# Patient Record
Sex: Female | Born: 1990
Health system: Southern US, Community
[De-identification: ages and names within clinical notes are randomized; demographics above are authoritative.]

## PROBLEM LIST (undated history)

## (undated) DIAGNOSIS — F99 Mental disorder, not otherwise specified: Secondary | ICD-10-CM

## (undated) DIAGNOSIS — O24419 Gestational diabetes mellitus in pregnancy, unspecified control: Secondary | ICD-10-CM

## (undated) DIAGNOSIS — F338 Other recurrent depressive disorders: Secondary | ICD-10-CM

## (undated) HISTORY — DX: Mental disorder, not otherwise specified: F99

## (undated) HISTORY — DX: Gestational diabetes mellitus in pregnancy, unspecified control: O24.419

## (undated) HISTORY — DX: Other recurrent depressive disorders: F33.8

---
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
## 2014-07-24 ENCOUNTER — Encounter (HOSPITAL_COMMUNITY): Payer: Self-pay | Admitting: Emergency Medicine

## 2014-07-24 ENCOUNTER — Emergency Department (HOSPITAL_COMMUNITY)
Admission: EM | Admit: 2014-07-24 | Discharge: 2014-07-24 | Disposition: A | Discharge status: Home / Self Care | Payer: Self-pay | Attending: Emergency Medicine | Admitting: Emergency Medicine

## 2014-07-24 DIAGNOSIS — J029 Acute pharyngitis, unspecified: Secondary | ICD-10-CM | POA: Insufficient documentation

## 2014-07-24 LAB — RAPID STREP SCREEN (MED CTR MEBANE ONLY): Streptococcus, Group A Screen (Direct): NEGATIVE

## 2014-07-24 NOTE — ED Provider Notes (Signed)
CSN: [id number]     Arrival date & time 07/24/14  0039 History   First MD Initiated Contact with Patient 07/24/14 [phone]     Chief Complaint  Patient presents with  . Sore Throat   HPI  24 year old female presents today with complaints of sore throat patient is with her father who insisted she be seen for sore throat and episodic coughing. Patient reports that for the past 3 nights she's had episodes of coughing that awakened her from sleep followed by sore throat. She notes she is able to fall back asleep without difficulty and reports no symptoms during the day. She denies upper respiratory symptoms including rhinorrhea initially watery eyes ear pain, sinus pressure, or sore throat aside from coughing episodes. She denies chest pain shortness of breath nausea vomiting, abdominal pain, or any other symptoms/signs. Patient has no complaints at time of evaluation, and when asked about her concerns today she states "I'm here because he wanted me to"  History reviewed. No pertinent past medical history. History reviewed. No pertinent past surgical history. No family history on file. History  Substance Use Topics  . Smoking status: Never Smoker   . Smokeless tobacco: Not on file  . Alcohol Use: No   OB History    No data available     Review of Systems  All other systems reviewed and are negative.   Allergies  Review of patient's allergies indicates no known allergies.  Home Medications   Prior to Admission medications   Not on File   BP 104/67 mmHg  Pulse 78  Temp(Src) 98.1 F (36.7 C)  Resp 17  Ht  (1.651 m)  Wt 107 lb (48.535 kg)  BMI 17.81 kg/m2  SpO2 99%  LMP 07/10/2014 Physical Exam  Constitutional: She is oriented to person, place, and time. She appears well-developed and well-nourished.  HENT:  Head: Normocephalic and atraumatic.  Right Ear: Hearing, tympanic membrane, external ear and ear canal normal.  Left Ear: Hearing, tympanic membrane, external ear and  ear canal normal.  Nose: Nose normal. No mucosal edema, rhinorrhea or sinus tenderness. Right sinus exhibits no maxillary sinus tenderness and no frontal sinus tenderness. Left sinus exhibits no maxillary sinus tenderness and no frontal sinus tenderness.  Mouth/Throat: Uvula is midline, oropharynx is clear and moist and mucous membranes are normal. No oropharyngeal exudate, posterior oropharyngeal edema, posterior oropharyngeal erythema or tonsillar abscesses.  Eyes: Conjunctivae are normal. Pupils are equal, round, and reactive to light. Right eye exhibits no discharge. Left eye exhibits no discharge. No scleral icterus.  Neck: Normal range of motion. Neck supple. No JVD present. No tracheal deviation present. No thyromegaly present.  Cardiovascular: Normal rate, regular rhythm and intact distal pulses.  Exam reveals no gallop and no friction rub.   No murmur heard. Pulmonary/Chest: Effort normal and breath sounds normal. No stridor. No respiratory distress. She has no wheezes. She has no rales. She exhibits no tenderness.  Musculoskeletal: Normal range of motion.  Neurological: She is alert and oriented to person, place, and time. Coordination normal.  Skin: Skin is warm and dry.  Psychiatric: She has a normal mood and affect. Her behavior is normal. Judgment and thought content normal.  Nursing note and vitals reviewed.   ED Course  Procedures (including critical care time) Labs Review Labs Reviewed  RAPID STREP SCREEN  CULTURE, GROUP A STREP    Imaging Review No results found.   EKG Interpretation None     MDM   Final diagnoses:  Sore throat   Negative rapid strep  Time of evaluation patient did not have any complaints, reporting the only time she has sore throat as when she coughs which has been episodic and only occurring once a night for the last 3 nights. Patient did not appear to have allergic symptoms, or any other source of infection. Patient was advised to use  Benadryl as needed at night for sleep. If new or worsening symptoms present she is instructed follow-up. Patient and her father understood and agreed to this plan.  Eyvonne MechanicJeffrey Alijah Akram, PA-C 07/24/14 [phone]  Blane OharaJoshua Zavitz, MD 07/24/14 [phone]

## 2014-07-24 NOTE — ED Notes (Signed)
Pt reports sore throat and dry cough X 3 days, took Dayquil at home without relief.

## 2014-07-24 NOTE — Discharge Instructions (Signed)
Please continue to monitor for any worsening signs or symptoms. Benadryl can be used at night for sleep and for potential allergy related symptoms. If patient continues to have cough and tonight please follow up with her primary care provider for further evaluation and management. If symptoms worsen including difficulty breathing, fever, difficulty swallowing, or any other concerning symptoms please follow up immediately.

## 2014-07-27 LAB — CULTURE, GROUP A STREP

## 2014-08-26 ENCOUNTER — Emergency Department (HOSPITAL_COMMUNITY)
Admission: EM | Admit: 2014-08-26 | Discharge: 2014-08-26 | Disposition: A | Discharge status: Home / Self Care | Payer: Self-pay | Attending: Emergency Medicine | Admitting: Emergency Medicine

## 2014-08-26 ENCOUNTER — Encounter (HOSPITAL_COMMUNITY): Payer: Self-pay | Admitting: Emergency Medicine

## 2014-08-26 DIAGNOSIS — Y998 Other external cause status: Secondary | ICD-10-CM | POA: Insufficient documentation

## 2014-08-26 DIAGNOSIS — S161XXA Strain of muscle, fascia and tendon at neck level, initial encounter: Secondary | ICD-10-CM | POA: Insufficient documentation

## 2014-08-26 DIAGNOSIS — S0081XA Abrasion of other part of head, initial encounter: Secondary | ICD-10-CM | POA: Insufficient documentation

## 2014-08-26 DIAGNOSIS — Y9389 Activity, other specified: Secondary | ICD-10-CM | POA: Insufficient documentation

## 2014-08-26 DIAGNOSIS — Y92410 Unspecified street and highway as the place of occurrence of the external cause: Secondary | ICD-10-CM | POA: Insufficient documentation

## 2014-08-26 MED ORDER — IBUPROFEN 600 MG PO TABS: 600.0000 mg | ORAL_TABLET | Freq: Four times a day (QID) | ORAL | Status: DC | PRN

## 2014-08-26 MED ORDER — IBUPROFEN 400 MG PO TABS
600.0000 mg | ORAL_TABLET | Freq: Once | ORAL | Status: AC
  Administered 2014-08-26: 600 mg via ORAL
  Filled 2014-08-26 (×2): qty 1

## 2014-08-26 NOTE — Discharge Instructions (Signed)
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
## 2015-05-12 HISTORY — PX: WISDOM TOOTH EXTRACTION: SHX21

## 2016-07-07 DIAGNOSIS — R87612 Low grade squamous intraepithelial lesion on cytologic smear of cervix (LGSIL): Secondary | ICD-10-CM | POA: Insufficient documentation

## 2016-10-18 ENCOUNTER — Encounter (HOSPITAL_COMMUNITY): Payer: Self-pay | Admitting: Emergency Medicine

## 2016-10-18 ENCOUNTER — Emergency Department (HOSPITAL_COMMUNITY)
Admission: EM | Admit: 2016-10-18 | Discharge: 2016-10-18 | Disposition: A | Discharge status: Home / Self Care | Payer: Self-pay | Attending: Emergency Medicine | Admitting: Emergency Medicine

## 2016-10-18 DIAGNOSIS — R112 Nausea with vomiting, unspecified: Secondary | ICD-10-CM | POA: Insufficient documentation

## 2016-10-18 DIAGNOSIS — K5900 Constipation, unspecified: Secondary | ICD-10-CM | POA: Insufficient documentation

## 2016-10-18 MED ORDER — POLYETHYLENE GLYCOL 3350 17 G PO PACK: 17.0000 g | PACK | Freq: Every day | ORAL | 0 refills | Status: DC

## 2016-10-18 MED ORDER — ONDANSETRON 4 MG PO TBDP
4.0000 mg | ORAL_TABLET | Freq: Once | ORAL | Status: AC
  Administered 2016-10-18: 4 mg via ORAL
  Filled 2016-10-18: qty 1

## 2016-10-18 MED ORDER — ONDANSETRON HCL 4 MG PO TABS: 4.0000 mg | ORAL_TABLET | Freq: Four times a day (QID) | ORAL | 0 refills | Status: DC

## 2016-10-18 NOTE — ED Provider Notes (Signed)
[facility]-EMERGENCY DEPT Provider Note   CSN: [id number] Arrival date & time: 10/18/16  1320     History   Chief Complaint Chief Complaint  Patient presents with  . Emesis  . Constipation    HPI Candace Lowe is a 26 y.o. female who presents to the emergency department with constipation, nausea and emesis. She reports her last bowel movement was 4 days ago, but she is still able to pass flatus. 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 She reports that she called her boss. She wasn't feeling well and returned home and vomited one more time. No fever, chills, abdominal pain, back pain, diarrhea, dysuria, vaginal pain, BRBPR, or any other symptoms at this time. No history of abdominal surgery. No chronic past medical history. No daily medications.  The history is provided by the patient. No language interpreter was used.    History reviewed. No pertinent past medical history.  There are no active problems to display for this patient.   History reviewed. No pertinent surgical history.  OB History    No data available       Home Medications    Prior to Admission medications   Medication Sig Start Date End Date Taking? Authorizing Provider  ondansetron (ZOFRAN) 4 MG tablet Take 1 tablet (4 mg total) by mouth every 6 (six) hours. 10/18/16   Dawanda Mapel A, PA-C  polyethylene glycol (MIRALAX) packet Take 17 g by mouth daily. 10/18/16   Anaira Seay A, PA-C    Family History No family history on file.  Social History Social History  Substance Use Topics  . Smoking status: Never Smoker  . Smokeless tobacco: Never Used  . Alcohol use No     Allergies   Mushroom extract complex   Review of Systems Review of Systems  Constitutional: Negative for activity change, chills and fever.  Respiratory: Negative for shortness of breath.   Cardiovascular: Negative for chest pain.  Gastrointestinal:  Positive for constipation, nausea and vomiting. Negative for abdominal pain and diarrhea.  Genitourinary: Negative for dysuria and vaginal pain.  Musculoskeletal: Negative for back pain.  Skin: Negative for rash.     Physical Exam Updated Vital Signs BP 108/78 (BP Location: Left Arm)   Pulse 60   Temp 98.6 F (37 C) (Oral)   Resp 18   LMP 10/17/2016   SpO2 100%   Physical Exam  Constitutional: No distress.  HENT:  Head: Normocephalic.  Eyes: Conjunctivae are normal.  Neck: Neck supple.  Cardiovascular: Normal rate, regular rhythm, normal heart sounds and intact distal pulses.  Exam reveals no gallop and no friction rub.   No murmur heard. Pulmonary/Chest: Effort normal and breath sounds normal. No respiratory distress. She has no wheezes. She has no rales.  Abdominal: Soft. Bowel sounds are normal. She exhibits no distension and no mass. There is no tenderness. There is no rebound and no guarding.  The abdomen is nontender. Normoactive bowel sounds in all 4 quadrants.  Musculoskeletal: Normal range of motion. She exhibits no edema or tenderness.  Neurological: She is alert.  Skin: Skin is warm. Capillary refill takes less than 2 seconds. No rash noted.  Psychiatric: Her behavior is normal.  Nursing note and vitals reviewed.    ED Treatments / Results  Labs (all labs ordered are listed, but only abnormal results are displayed) Labs Reviewed - No data to display  EKG  EKG Interpretation None       Radiology No results found.  Procedures Procedures (including critical care time)  Medications Ordered in ED Medications  ondansetron (ZOFRAN-ODT) disintegrating tablet 4 mg (4 mg Oral Given 10/18/16 1446)     Initial Impression / Assessment and Plan / ED Course  I have reviewed the triage vital signs and the nursing notes.  Pertinent labs & imaging results that were available during my care of the patient were reviewed by me and considered in my medical decision  making (see chart for details).     Patient with constipation and emesis 2. Nausea improved in the emergency department after Zofran administration. Physical exam of the abdomen is unremarkable. Bowel sounds present in all 4 quadrants. The patient is afebrile. Vital signs stable. No acute distress. No clinical signs of dehydration. At this time, I do not feel further labs or imaging is warranted. Patient successfully fluid challenge. Will discharge the patient to home with PCP follow-up, Zofran, and MiraLAX. Discussed the plan with the patient has agreeable at this time.  Final Clinical Impressions(s) / ED Diagnoses   Final diagnoses:  Constipation, unspecified constipation type    New Prescriptions Discharge Medication List as of 10/18/2016  3:35 PM    START taking these medications   Details  ondansetron (ZOFRAN) 4 MG tablet Take 1 tablet (4 mg total) by mouth every 6 (six) hours., Starting Sun 10/18/2016, Print    polyethylene glycol (MIRALAX) packet Take 17 g by mouth daily., Starting Sun 10/18/2016, Print         Jeanie Mccard A, PA-C 10/20/16 1610    Azalia Bilis, MD 10/26/16 1008

## 2016-10-18 NOTE — Discharge Instructions (Signed)
You can take Zofran every 6 hours as needed for nausea. You can use Miralax daily until you have a soft bowel movement. If you call the number on your discharge paperwork to get established with a primary care provider or you can call [employer] directly.  If you develop new or worsening symptoms including the inability to pass gas, worsening constipation or vomiting, fever, or chills, please return to the Emergency Department for re-evaluation.

## 2016-10-18 NOTE — ED Triage Notes (Addendum)
Pt from home with c/o emesis that began today and constipation. Pt's LBM was 3-5 days ago. Pt denies fever or chills. Pt denies urinary symptoms. Pt has had 2 episodes of emesis today. Pt has hypoactive bowel sounds. Pt refusing blood work at this time

## 2018-07-20 ENCOUNTER — Encounter (HOSPITAL_COMMUNITY): Payer: Self-pay | Admitting: Emergency Medicine

## 2018-07-20 ENCOUNTER — Other Ambulatory Visit: Payer: Self-pay

## 2018-07-20 ENCOUNTER — Emergency Department (HOSPITAL_COMMUNITY)
Admission: EM | Admit: 2018-07-20 | Discharge: 2018-07-20 | Disposition: A | Discharge status: Home / Self Care | Payer: Self-pay | Attending: Emergency Medicine | Admitting: Emergency Medicine

## 2018-07-20 ENCOUNTER — Emergency Department (HOSPITAL_COMMUNITY): Payer: Self-pay

## 2018-07-20 DIAGNOSIS — R6883 Chills (without fever): Secondary | ICD-10-CM | POA: Insufficient documentation

## 2018-07-20 DIAGNOSIS — J40 Bronchitis, not specified as acute or chronic: Secondary | ICD-10-CM | POA: Insufficient documentation

## 2018-07-20 DIAGNOSIS — R0981 Nasal congestion: Secondary | ICD-10-CM | POA: Insufficient documentation

## 2018-07-20 DIAGNOSIS — R070 Pain in throat: Secondary | ICD-10-CM | POA: Insufficient documentation

## 2018-07-20 MED ORDER — AZITHROMYCIN 250 MG PO TABS: 250.0000 mg | ORAL_TABLET | Freq: Every day | ORAL | 0 refills | Status: DC

## 2018-07-20 NOTE — ED Provider Notes (Signed)
[facility]-EMERGENCY DEPT Provider Note   CSN: [id number] Arrival date & time: 07/20/18  1124    History   Chief Complaint Chief Complaint  Patient presents with  . Cough  . Sore Throat    HPI Candace Lowe is a 28 y.o. female here presenting with sore throat, congestion, chills.  Patient states that for the last week or so she has been having productive cough with yellow sputum.  She also has some sore throat and sinus congestion.  She has some subjective chills as well.  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  Sent here from work for work note.     The history is provided by the patient.    History reviewed. No pertinent past medical history.  There are no active problems to display for this patient.   History reviewed. No pertinent surgical history.   OB History   No obstetric history on file.      Home Medications    Prior to Admission medications   Medication Sig Start Date End Date Taking? Authorizing Provider  ondansetron (ZOFRAN) 4 MG tablet Take 1 tablet (4 mg total) by mouth every 6 (six) hours. 10/18/16   McDonald, Mia A, PA-C  polyethylene glycol (MIRALAX) packet Take 17 g by mouth daily. 10/18/16   McDonald, Mia A, PA-C    Family History No family history on file.  Social History Social History   Tobacco Use  . Smoking status: Never Smoker  . Smokeless tobacco: Never Used  Substance Use Topics  . Alcohol use: No  . Drug use: Not on file     Allergies   Mushroom extract complex   Review of Systems Review of Systems  Respiratory: Positive for cough.   All other systems reviewed and are negative.    Physical Exam Updated Vital Signs BP 107/70 (BP Location: Left Arm)   Pulse 72   Temp 98.2 F (36.8 C) (Oral)   Resp 16   Ht 5\' 4"  (1.626 m)   Wt 45.8 kg   LMP 06/29/2018   SpO2 94%   BMI 17.34 kg/m   Physical Exam Vitals signs and nursing note reviewed.  Constitutional:      Comments:  Coughing   HENT:     Head: Normocephalic.     Mouth/Throat:     Mouth: Mucous membranes are moist.     Comments: OP slightly red, no tonsillar exudates  Eyes:     Conjunctiva/sclera: Conjunctivae normal.  Neck:     Musculoskeletal: Normal range of motion.  Cardiovascular:     Rate and Rhythm: Normal rate and regular rhythm.  Pulmonary:     Effort: Pulmonary effort is normal.     Comments: Diminished bilaterally, no obvious wheezing or crackles  Abdominal:     General: Bowel sounds are normal.     Palpations: Abdomen is soft.  Skin:    General: Skin is warm.     Capillary Refill: Capillary refill takes less than 2 seconds.  Neurological:     General: No focal deficit present.     Mental Status: She is alert and oriented to person, place, and time.  Psychiatric:        Mood and Affect: Mood normal.        Behavior: Behavior normal.      ED Treatments / Results  Labs (all labs ordered are listed, but only abnormal results are displayed) Labs Reviewed - No data  to display  EKG None  Radiology No results found.  Procedures Procedures (including critical care time)  Medications Ordered in ED Medications - No data to display   Initial Impression / Assessment and Plan / ED Course  I have reviewed the triage vital signs and the nursing notes.  Pertinent labs & imaging results that were available during my care of the patient were reviewed by me and considered in my medical decision making (see chart for details).       Candace Lowe is a 28 y.o. female here with productive cough, chills. Afebrile, no recent travel or sick contacts. Likely bronchitis vs early pneumonia. Will get CXR.   1:11 PM CXR clear. Consider atypical pneumonia. Will give zpack. Stable for discharge.    Final Clinical Impressions(s) / ED Diagnoses   Final diagnoses:  None    ED Discharge Orders    None       Charlynne Pander, MD 07/20/18 1311

## 2018-07-20 NOTE — ED Triage Notes (Addendum)
Pt c/o cough and congestion with sore throat for week. Sputum is yellowish when coughing up. Pt was sent home from work today and needs note before she can return .

## 2018-07-20 NOTE — Discharge Instructions (Signed)
Take zpack as prescribed.   Take tylenol, motrin for fever.   See your doctor.   Rest for 3 days at home   Return to ER if you have worse cough, fever, trouble breathing

## 2019-06-22 ENCOUNTER — Other Ambulatory Visit: Payer: Self-pay

## 2019-06-22 ENCOUNTER — Ambulatory Visit (HOSPITAL_COMMUNITY)
Admission: EM | Admit: 2019-06-22 | Discharge: 2019-06-22 | Disposition: A | Discharge status: Home / Self Care | Payer: Self-pay | Attending: Emergency Medicine | Admitting: Emergency Medicine

## 2019-06-22 ENCOUNTER — Encounter (HOSPITAL_COMMUNITY): Payer: Self-pay

## 2019-06-22 DIAGNOSIS — N898 Other specified noninflammatory disorders of vagina: Secondary | ICD-10-CM

## 2019-06-22 DIAGNOSIS — Z3202 Encounter for pregnancy test, result negative: Secondary | ICD-10-CM

## 2019-06-22 LAB — POCT PREGNANCY, URINE: Preg Test, Ur: NEGATIVE

## 2019-06-22 LAB — POC URINE PREG, ED
Preg Test, Ur: NEGATIVE
Preg Test, Ur: NEGATIVE

## 2019-06-22 MED ORDER — FLUCONAZOLE 150 MG PO TABS: 150.0000 mg | ORAL_TABLET | Freq: Once | ORAL | 0 refills | Status: AC

## 2019-06-22 NOTE — ED Triage Notes (Signed)
Pt state she has yeast infection x 2 days.

## 2019-06-22 NOTE — ED Provider Notes (Signed)
[place]    CSN: [id number] Arrival date & time: 06/22/19  1037      History   Chief Complaint Chief Complaint  Patient presents with  . Vaginal Discharge    HPI Candace Lowe is a 29 y.o. female significant past medical history presenting today for evaluation of vaginal discharge and irritation.  Patient states that over the past 3 days she has had some irritation and itching, and the past 24 hours she has developed associated discharge.  Believes this is similar to past yeast infection and has history of multiple yeast infections.  Not a significant history of bacterial vaginosis.  She denies any abdominal pain or pelvic pain.  Denies nausea or vomiting.  Last menstrual cycle ended approximately 1.5 weeks ago.  She is not on any form of birth control.  Is in monogamous relationship with 1 partner.  Denies concerns for STDs.  HPI  History reviewed. No pertinent past medical history.  There are no problems to display for this patient.   History reviewed. No pertinent surgical history.  OB History   No obstetric history on file.      Home Medications    Prior to Admission medications   Medication Sig Start Date End Date Taking? Authorizing Provider  azithromycin (ZITHROMAX) 250 MG tablet Take 1 tablet (250 mg total) by mouth daily. Take first 2 tablets together, then 1 every day until finished. 07/20/18   Drenda Freeze, MD  fluconazole (DIFLUCAN) 150 MG tablet Take 1 tablet (150 mg total) by mouth once for 1 dose. 06/22/19 06/22/19  Vasti Yagi C, PA-C  ondansetron (ZOFRAN) 4 MG tablet Take 1 tablet (4 mg total) by mouth every 6 (six) hours. 10/18/16   McDonald, Mia A, PA-C  polyethylene glycol (MIRALAX) packet Take 17 g by mouth daily. 10/18/16   McDonald, Mia A, PA-C    Family History No family history on file.  Social History Social History   Tobacco Use  . Smoking status: Never Smoker  . Smokeless tobacco: Never Used  Substance Use Topics    . Alcohol use: No  . Drug use: Not on file     Allergies   Mushroom extract complex   Review of Systems Review of Systems  Constitutional: Negative for fever.  Respiratory: Negative for shortness of breath.   Cardiovascular: Negative for chest pain.  Gastrointestinal: Negative for abdominal pain, diarrhea, nausea and vomiting.  Genitourinary: Positive for vaginal discharge. Negative for dysuria, flank pain, genital sores, hematuria, menstrual problem, vaginal bleeding and vaginal pain.  Musculoskeletal: Negative for back pain.  Skin: Negative for rash.  Neurological: Negative for dizziness, light-headedness and headaches.     Physical Exam Triage Vital Signs ED Triage Vitals  Enc Vitals Group     BP --      Pulse Rate 06/22/19 1111 64     Resp 06/22/19 1111 16     Temp 06/22/19 1111 98.6 F (37 C)     Temp Source 06/22/19 1111 Oral     SpO2 06/22/19 1111 100 %     Weight 06/22/19 1110 103 lb (46.7 kg)     Height --      Head Circumference --      Peak Flow --      Pain Score 06/22/19 1109 5     Pain Loc --      Pain Edu? --      Excl. in [place]? --    No data found.  Updated  Vital Signs Pulse 64   Temp 98.6 F (37 C) (Oral)   Resp 16   Wt 103 lb (46.7 kg)   SpO2 100%   BMI 17.68 kg/m   Visual Acuity Right Eye Distance:   Left Eye Distance:   Bilateral Distance:    Right Eye Near:   Left Eye Near:    Bilateral Near:     Physical Exam Vitals and nursing note reviewed.  Constitutional:      Appearance: She is well-developed.     Comments: No acute distress  HENT:     Head: Normocephalic and atraumatic.     Nose: Nose normal.  Eyes:     Conjunctiva/sclera: Conjunctivae normal.  Cardiovascular:     Rate and Rhythm: Normal rate.  Pulmonary:     Effort: Pulmonary effort is normal. No respiratory distress.  Abdominal:     General: There is no distension.     Comments: Soft, nondistended, nontender to light and deep palpation throughout   Musculoskeletal:        General: Normal range of motion.     Cervical back: Neck supple.  Skin:    General: Skin is warm and dry.  Neurological:     Mental Status: She is alert and oriented to person, place, and time.      UC Treatments / Results  Labs (all labs ordered are listed, but only abnormal results are displayed) Labs Reviewed  POC URINE PREG, ED  POCT PREGNANCY, URINE  POC URINE PREG, ED  CERVICOVAGINAL ANCILLARY ONLY    EKG   Radiology No results found.  Procedures Procedures (including critical care time)  Medications Ordered in UC Medications - No data to display  Initial Impression / Assessment and Plan / UC Course  I have reviewed the triage vital signs and the nursing notes.  Pertinent labs & imaging results that were available during my care of the patient were reviewed by me and considered in my medical decision making (see chart for details).    Pregnancy test negative. Empirically treating for yeast today with Diflucan.  Vaginal swab pending to further evaluate for causes of discharge and irritation.  Will call with results and provide further treatment as needed.  Discussed strict return precautions. Patient verbalized understanding and is agreeable with plan.  Final Clinical Impressions(s) / UC Diagnoses   Final diagnoses:  Vaginal discharge     Discharge Instructions     Take 1 tablet of Diflucan today, may repeat in 3 to 4 days if swab returns positive for yeast and still having symptoms  We are testing you for Gonorrhea, Chlamydia, Trichomonas, Yeast and Bacterial Vaginosis. We will call you if anything is positive and let you know if you require any further treatment. Please inform partners of any positive results.   Please return if symptoms not improving with treatment, development of fever, nausea, vomiting, abdominal pain.     ED Prescriptions    Medication Sig Dispense Auth. Provider   fluconazole (DIFLUCAN) 150 MG tablet  Take 1 tablet (150 mg total) by mouth once for 1 dose. 2 tablet Tameisha Covell, [place] C, PA-C     PDMP not reviewed this encounter.   Lew Dawes, PA-C 06/22/19 1218

## 2019-06-22 NOTE — Discharge Instructions (Addendum)
Take 1 tablet of Diflucan today, may repeat in 3 to 4 days if swab returns positive for yeast and still having symptoms  We are testing you for Gonorrhea, Chlamydia, Trichomonas, Yeast and Bacterial Vaginosis. We will call you if anything is positive and let you know if you require any further treatment. Please inform partners of any positive results.   Please return if symptoms not improving with treatment, development of fever, nausea, vomiting, abdominal pain.

## 2019-06-27 LAB — CERVICOVAGINAL ANCILLARY ONLY
Bacterial vaginitis: POSITIVE — AB
Candida vaginitis: POSITIVE — AB
Chlamydia: NEGATIVE
Neisseria Gonorrhea: NEGATIVE
Trichomonas: NEGATIVE

## 2019-06-28 ENCOUNTER — Encounter (HOSPITAL_COMMUNITY): Payer: Self-pay

## 2019-06-28 ENCOUNTER — Telehealth (HOSPITAL_COMMUNITY): Payer: Self-pay | Admitting: Emergency Medicine

## 2019-06-28 MED ORDER — METRONIDAZOLE 500 MG PO TABS: 500.0000 mg | ORAL_TABLET | Freq: Two times a day (BID) | ORAL | 0 refills | Status: AC

## 2019-06-28 NOTE — Telephone Encounter (Signed)
Bacterial vaginosis is positive. Pt needs treatment. Flagyl 500 mg BID x 7 days #14 no refills sent to patients pharmacy of choice.    Candida (yeast) is positive.  Prescription for fluconazole was given at the urgent care visit.    Attempted to reach patient. No answer at this time. Voicemail left.   If you have any questions, you may call me at [phone]

## 2019-12-07 ENCOUNTER — Ambulatory Visit (INDEPENDENT_AMBULATORY_CARE_PROVIDER_SITE_OTHER): Payer: Self-pay | Admitting: Lactation Services

## 2019-12-07 ENCOUNTER — Other Ambulatory Visit: Payer: Self-pay

## 2019-12-07 ENCOUNTER — Ambulatory Visit (HOSPITAL_COMMUNITY)
Admission: EM | Admit: 2019-12-07 | Discharge: 2019-12-07 | Disposition: A | Discharge status: Home / Self Care | Payer: Self-pay

## 2019-12-07 DIAGNOSIS — Z3201 Encounter for pregnancy test, result positive: Secondary | ICD-10-CM

## 2019-12-07 LAB — POCT PREGNANCY, URINE: Preg Test, Ur: POSITIVE — AB

## 2019-12-07 MED ORDER — PRENATAL VITAMINS 28-0.8 MG PO TABS: 1.0000 | ORAL_TABLET | Freq: Every day | ORAL | 11 refills | Status: DC

## 2019-12-07 NOTE — Progress Notes (Signed)
Pregnancy test +. Called patient with results, patient did not answer, LM for patient to call the office for results.   Called patient to give her results at 4:12 pm. Patient reports her LMP was 10/17/2019 and some spotting around July 5 on and off for 3 days. Patient with + UPT yesterday at home. Discussed that some spotting is normal with pregnancy.   LMP of 10/17/2019 with EDD 07/24/2019 (7 weeks 2 days). Note to front desk to call and schedule for New OB intake and new OB appt   Patient is not taking PNV. Prescription for PNV sent to [facility] on [address].    Ectopic precautions reviewed. Location of MAU given.   Patient to call with questions or concerns as needed

## 2019-12-08 NOTE — Progress Notes (Signed)
Chart reviewed for nurse visit. Agree with plan of care.   Duane Lope, NP 12/08/2019 4:24 PM

## 2020-01-05 ENCOUNTER — Encounter: Payer: Self-pay | Admitting: Medical

## 2020-01-05 ENCOUNTER — Other Ambulatory Visit: Payer: Self-pay

## 2020-01-05 ENCOUNTER — Ambulatory Visit (INDEPENDENT_AMBULATORY_CARE_PROVIDER_SITE_OTHER): Payer: Self-pay | Admitting: Medical

## 2020-01-05 ENCOUNTER — Other Ambulatory Visit (HOSPITAL_COMMUNITY)
Admission: RE | Admit: 2020-01-05 | Discharge: 2020-01-05 | Disposition: A | Discharge status: Home / Self Care | Payer: Self-pay | Source: Ambulatory Visit | Attending: Medical | Admitting: Medical

## 2020-01-05 VITALS — BP 106/72 | HR 72 | Wt 98.0 lb

## 2020-01-05 DIAGNOSIS — Z3481 Encounter for supervision of other normal pregnancy, first trimester: Secondary | ICD-10-CM | POA: Insufficient documentation

## 2020-01-05 DIAGNOSIS — Z3A11 11 weeks gestation of pregnancy: Secondary | ICD-10-CM

## 2020-01-05 DIAGNOSIS — Z3480 Encounter for supervision of other normal pregnancy, unspecified trimester: Secondary | ICD-10-CM | POA: Insufficient documentation

## 2020-01-05 DIAGNOSIS — O219 Vomiting of pregnancy, unspecified: Secondary | ICD-10-CM

## 2020-01-05 DIAGNOSIS — Z3689 Encounter for other specified antenatal screening: Secondary | ICD-10-CM

## 2020-01-05 DIAGNOSIS — Z3A19 19 weeks gestation of pregnancy: Secondary | ICD-10-CM

## 2020-01-05 LAB — POCT URINALYSIS DIP (DEVICE)
Bilirubin Urine: NEGATIVE
Glucose, UA: NEGATIVE mg/dL
Hgb urine dipstick: NEGATIVE
Ketones, ur: NEGATIVE mg/dL
Leukocytes,Ua: NEGATIVE
Nitrite: NEGATIVE
Protein, ur: NEGATIVE mg/dL
Specific Gravity, Urine: 1.02 (ref 1.005–1.030)
Urobilinogen, UA: 0.2 mg/dL (ref 0.0–1.0)
pH: 7.5 (ref 5.0–8.0)

## 2020-01-05 MED ORDER — PROMETHAZINE HCL 25 MG PO TABS: 25.0000 mg | ORAL_TABLET | Freq: Four times a day (QID) | ORAL | 0 refills | Status: DC | PRN

## 2020-01-05 NOTE — Patient Instructions (Addendum)
AREA PEDIATRIC/FAMILY PRACTICE PHYSICIANS  [place] ([address]) . [facility] o Chambliss, MD; Eniola, MD; Hale, MD; Hensel, MD; McDiarmid, MD; McIntyer, MD; Neal, MD; Walden, MD o [address] [place], [place] [address] o [phone] o Mon-Fri 8:30-12:30, 1:30-5:00 o Providers come to see babies at [facility] o Accepting Medicaid . [facility] o Limited providers who accept newborns: Koirala, MD; Morrow, MD; Wolters, MD o [address], [place], [place] [address] o [phone] o Mon-Fri 8:00-5:30 o Babies seen by providers at [facility] o Does NOT accept Medicaid o Please call early in hospitalization for appointment (limited availability)  . [facility] o Mulberry, MD o [address] [place], [place] [address] o [phone] o Mon, Tue, Thur, Fri 8:30-5:00, Wed 10:00-7:00 (closed 1-2pm) o Babies seen by [facility] providers o Accepting Medicaid . [facility] - Pediatrician o Rubin, MD o [address], [place], [place] [address] o [phone] o Mon-Fri 8:30-5:00, Sat 8:30-12:00 o Provider comes to see babies at [facility] o Accepting Medicaid o Must have been referred from current patients or contacted office prior to delivery . [facility] ([facility]) o Brown, MD; Chandler, MD; Ettefagh, MD; Grant, MD; Lester, MD; McCormick, MD; McQueen, MD; Prose, MD; Simha, MD; Stanley, MD; Stryffeler, NP; Tebben, NP o [address], [place], [place] [address] o [phone] o Mon, Tue, Thur, Fri 8:30-5:30, Wed 9:30-5:30, Sat 8:30-12:30 o Babies seen by [facility] providers o Accepting Medicaid o Only accepting infants of first-time parents or siblings of current patients o Hospital discharge coordinator will make follow-up appointment . Jack Amos o [address],  [place], [place]  [address] o [phone]   Fax - [phone] . [facility] o [address], [place], [place]  [address] o Phone - [phone]   Fax - [phone] . Shilpa Gosrani o [address], [place], [place]  [address] o [phone]  [place] ([address]) . [facility] o Bates, MD; [place], MD; Cooper, Cox, MD; MD; Davis, MD; Dovico, MD; Ettefaugh, MD; Little, MD; Lowe, MD; Keiffer, MD; Melvin, MD; Sumner, MD; Williams, MD o [address], [place], [place] [address] o [phone] o Mon-Fri 8:30-5:00 (extended evenings Mon-Thur as needed), Sat-Sun 10:00-1:00 o Providers come to see babies at [facility] o Accepting Medicaid for families of first-time babies and families with all children in the household age 3 and under. Must register with office prior to making appointment (M-F only). . [facility] o Henson, NP; Knapp, MD; Lalonde, MD; Tysinger, PA o [address] [place], [place] [address] o [phone] o Mon-Fri 8:00-5:00 o Babies seen by providers at [facility] o Does NOT accept Medicaid/Commercial Insurance Only . [facility] ([facility]  o Artis, MD; Barnes, MD; Bratton, MD; Coccaro, MD; Lockett Gardner, MD; Kramer, MD; Marshall, MD; Netherton, MD; Poleto, MD; Skinner, MD o [address] [place], [place] [address] o [phone] o Mon-Fri 8:30-5:30, Sat (Oct.-Mar.) 9:00-1:00 o Babies seen by providers at [facility] o Accepting Medicaid  [place] ([address]) . [facility] o Reid, MD; Warner, MD o [address], [place],  [address] o [phone] o Mon-Fri 8:30-5:00, Sat 8:30-12:00 o Providers come to see babies at [facility] o Does NOT accept Medicaid . [facility] o Becker, PA; Hagler, MD; Scifres, PA; Sun, MD; Swayne, MD o [address],  [place], [place] [address] o [phone] o Mon-Fri 8:00-5:00 o Babies seen by providers at [facility] o Does NOT accept Medicaid o Only accepting babies of parents who are patients o Please call early in hospitalization for appointment (limited availability) . [facility] o Clark, MD; Frye, MD; Kelleher, MD; Mack, NP; Miller, MD; O'Keller, MD; Patterson, NP; Pudlo, MD; Puzio, MD; Thomas, MD; Tucker, MD; Twiselton, MD o [address], [place], [place] [address] o [phone] o Mon-Fri 8:00-5:00, Sat 9:00-12:00 o Providers come to see babies at [facility] o Does NOT accept Medicaid  [place] ([address]) . [facility] o Limited providers accepting new patients: Brake, NP; Wharton, PA o [address], [place], [place] [address] o [phone] o Mon-Fri 8:00-5:00 o Babies seen by providers at [facility] o Does NOT accept Medicaid o Only accepting babies of parents who are patients o Please call early in hospitalization for appointment (limited availability) . [facility] o Gay, MD; Quinlan, MD o [address] [place], [place] [address] o [phone] (press 1 to schedule appointment) o Mon-Fri 8:00-5:00 o Providers come to see babies at [facility] o Does NOT accept Medicaid . [facility] o Mazer, MD o [address] [place], [place] [address] o [phone] o Mon-Fri 8:30-5:00 (lunch 12:30-1:00), extended hours by appointment only Wed 5:00-6:30 o Babies seen by [facility] providers o Accepting Medicaid . [facility] o Banks, MD; Jordan, MD; Koberlein, MD o [address], [place], [place] [address] o [phone] o Mon-Fri 8:00-5:00 o Babies seen by [facility] providers o Does NOT accept Medicaid . [facility] o Parker, MD; Hunter, MD; Wallace, DO o [address] [place], [place]  [address] o [phone] o Mon-Fri 8:00-5:00 o Babies seen by [facility] providers o Does NOT accept Medicaid . [facility] o Brandon, PA; Brecken, PA; Christy, NP; Dees, MD; DeClaire, MD; DeWeese, MD; Hansen, NP; Mills, NP; Parrish, NP; Smoot, NP; Summer, MD; Vapne, MD o [address] [place], [place] [address] o [phone] o Mon-Fri 8:30-5:00, Sat 10:00-1:00 o Providers come to see babies at [facility] o Does NOT accept Medicaid o Free prenatal information session Tuesdays at 4:45pm . [facility] o Bouska, MD; Gordon, PA; Jeffery, PA; Weber, PA o [address] [place] [address] o [phone] o Mon-Fri 7:30-5:30 o Babies seen by [facility] providers . [facility] o [address], [place], [place], [place]  [address] o [phone]   Fax - [phone]  [place] ([phone]) . [facility] o Reese, MD o [address] [place], [place] [address] o [phone] o Mon-Thur 8:00-6:00 o Providers come to see babies at [facility] o Accepting Medicaid . [facility] o Anderson, NP; Badger, MD; Beal, PA; Spencer, PA o [address] [place],  [address] o [phone] o Mon-Thur 7:30-7:30, Fri 7:30-4:30 o Babies seen by [facility] providers o Accepting Medicaid . [facility] o Agbuya, MD; Klett, NP; Romgoolam, MD o [address], ,  [address] o [phone] o Mon-Fri 8:30-5:00, Sat 8:30-12:00 o Providers come to see babies at [facility] o Accepting Medicaid o Must have "Meet & Greet" appointment at office prior to delivery . [facility]  ([facility] ) o McCord,   MD; Juleen China, MD; Clydene Laming, [address], [place], [place] [address] o [phone] o Mon-Wed 8:00-6:00, Thur-Fri 8:00-5:00, Sat 9:00-12:00 o Providers come to  see babies at [facility] o Does NOT accept Medicaid o Only accepting siblings of current patients . [facility] [place], [place], [place], [place]  [address] o [phone]   Fax [phone] . [facility] [address], [place], [place]  [address] o [phone]   Fax - Morton Gorman [phone] & [phone]) . Therapist, music at [place], DO; [place], [address] [place], [place] [address] o [phone] o Mon-Fri 7:00-5:00 o Babies seen by [facility] providers o Does NOT accept Medicaid . [place], MD; [place], [place]; [place], [place], [place], [place] [address] o [phone] o Mon-Fri 8:00-5:00 o Babies seen by [facility] providers o Accepting Medicaid . Lamont, MD; [place], [place]; [place], NP; [place], [place], [place], [place] [address] o [phone] o Mon-Fri 8:00-5:00 o Babies seen by providers at [facility] [place] [phone]) . [facility], [place] o [address] [place], [place] [address] o [phone] o Mon-Fri 8:00-5:00 o Babies seen by [facility] providers o Does NOT accept Medicaid o Limited availability, please call early in hospitalization to schedule follow-up . [facility] Leilani Merl, PA; Maisie Fus, MD; [place], MD; [place], [place]; Jeannine Kitten, MD; [place], [place] [facility] [address], [place], [place] [address] o [phone] o Mon-Fri 8:30-5:00, Sat 9:00-12:00 o Babies seen by providers at [facility] o Accepting Medicaid o Please register online then schedule online or call office o [email or web address] . [place] [facility]  [place]) Kristian Covey, NP; Dwyane Dee, MD; Leonidas Romberg, PA o [address] [place], [place], [place] [address] o [phone] o Mon-Fri 8:00-5:00 o Babies seen by providers at [facility] o Accepting Medicaid . [place] ([facility]) Dairl Ponder, MD; Rayvon Char, NP; Melina Modena, MD o [address] [place], [place], [place] [address] o [phone] o Mon-Fri 8:00-5:30, Sat&Sun by appointment (phones open at 8:30) o Babies seen by [facility] providers o Accepting Medicaid o Must be a first-time baby or sibling of current patient . [place], [address], [place], [place]  [address] o [phone]   Fax - [phone]  [place] [phone] & [phone]) . [place], [place]; [place], [place]; Benjamine Mola, MD; [place], [place]; Harrell Lark, MD o [address] [place], [place] [address] o [phone] o Mon-Thur 8:00-7:00, Fri 8:00-5:00, Sat 8:00-12:00, Sun 9:00-12:00 o Babies seen by [facility] providers o Accepting Medicaid . [facility], MD; Ruthann Cancer, MD; [facility], MD o [address], [place], [place] [address] o [phone] o Mon-Thur 8:00-5:00 o Babies seen by providers at [facility] o Accepting Medicaid . [facility] [place], MD; Coe[place], MD; Amedeo Plenty, MD; Bobby Rumpf, MD; List, MD; Lavonia Drafts, MD; Ruthann Cancer, MD; Selinda Eon, MD; Audie Box, MD; Jim Like, MD; Christie Nottingham, MD; Hubbard Hartshorn, MD; Modena Nunnery, MD o [address] [place], [place]  [address] o [phone] o Mon-Fri 8:00-5:30, Sat (Oct.-Mar.) 9:00-1:00 o Babies seen by providers at [facility] o Accepting Medicaid o Must fill out new patient packet, available online at [email or web address] . [place] ([facility]) Barnabas Lister, NP; [place]on Kingfisher, NP; Claiborne Billings, NP; Rolla Plate, MD;  [place], [place]; Carola Rhine, MD; Tyron Russell, MD; Delia Chimes, NP o [address], [place], [place] [address] o [phone] o Mon-Thur 8:00-5:30, Fri 8:00-5:00 o Babies seen by providers at [place] [phone]) . [place], [place]; [place], MD; Dennard Schaumann, MD; [place], [place] o [address] [place], [place] [address] o [phone] o Mon-Fri 8:00-5:00 o Babies seen by providers at [place] [phone]) . [place] [facility] [place], [place]; Olen Pel, MD; [place], [place], [place], [place] [address] o [phone] o Mon-Fri 8:00-5:00 o Babies seen by providers at [facility] o Does NOT accept Medicaid o Limited appointment availability, please call early in hospitalization  . Therapist, music at [place], [place]; [place], [address], [place], [place] [address] o [phone] o Mon-Fri 8:00-5:00 o Babies seen by [facility] providers o Does NOT accept Medicaid . [facility] Su Grand, MD; Guy Sandifer, MD; [place], [place]; [place], [place] [address], [place], [place] [address] o [phone] o Mon-Fri 8:00-5:00 o After hours clinic [facility] [place], [place] [address]) [phone] Mon-Fri 5:00-8:00, Sat 12:00-6:00, Sun 10:00-4:00 o Babies seen by [facility] providers o Accepting Medicaid . [place] [facility] o [address], [place], [place]  [address] o [phone]   Fax - [phone]  [place] [phone]) . Therapist, music at [facility], MD o [address] [place], [place], [place] [address] o [phone] o Mon-Fri 8:00-5:00 o Babies seen by [facility] providers o Does NOT accept Medicaid . [place] [facility]) Bing Neighbors, MD o [address] [place], [place],   [address] o [phone] o Mon-Thur 8:00-7:00, Fri 8:00-5:00, Sat 8:00-12:00 o Babies seen by providers at [facility] o Accepting Medicaid - but does not have vaccinations in office (must be received elsewhere) o Limited availability, please call early in hospitalization  [place] ([address]) . [place], MD o [address] [place] [address] o [phone]  Fax [phone]   Childbirth Education Options: [facility] Department Classes:  Childbirth education classes can help you get ready for a positive parenting experience. You can also meet other expectant parents and get free stuff for your baby. Each class runs for five weeks on the same night and costs $45 for the mother-to-be and her support person. Medicaid covers the cost if you are eligible. Call [phone] to register. [facility] Childbirth Education:  [phone] or [phone] or [email or web address]  Baby & Me Class: Discuss newborn & infant parenting and family adjustment issues with other new mothers in a relaxed environment. Each week brings a new speaker or baby-centered activity. We encourage new mothers to join [place] every Thursday at 11:00am. Babies birth until crawling. No registration or fee. Daddy [employer]: This course offers Dads-to-be the tools and knowledge needed to feel confident on their journey to becoming new fathers. Experienced dads, who have been trained as coaches, teach dads-to-be how  to hold, comfort, diaper, swaddle and play with their infant while being able to support the new mom as well. A class for men taught by men. $25/dad Big Brother/Big Sister: Let your children share in the joy of a new brother or sister in this special class designed just for them. Class includes discussion about how families care for babies: swaddling, holding, diapering, safety as well as how they can be helpful in their new role. This class is designed for  children ages 77 to 29, but any age is welcome. Please register each child individually. $5/child  Mom Talk: This mom-led group offers support and connection to mothers as they journey through the adjustments and struggles of that sometimes overwhelming first year after the birth of a child. Tuesdays at 10:00am and Thursdays at 6:00pm. Babies welcome. No registration or fee. Breastfeeding Support Group: This group is a mother-to-mother support circle where moms have the opportunity to share their breastfeeding experiences. A Lactation Consultant is present for questions and concerns. Meets each Tuesday at 11:00am. No fee or registration. Breastfeeding Your Baby: Learn what to expect in the first days of breastfeeding your newborn.  This class will help you feel more confident with the skills needed to begin your breastfeeding experience. Many new mothers are concerned about breastfeeding after leaving the hospital. This class will also address the most common fears and challenges about breastfeeding during the first few weeks, months and beyond. (call for fee) Comfort Techniques and Tour: This 2 hour interactive class will provide you the opportunity to learn & practice hands-on techniques that can help relieve some of the discomfort of labor and encourage your baby to rotate toward the best position for birth. You and your partner will be able to try a variety of labor positions with birth balls and rebozos as well as practice breathing, relaxation, and visualization techniques. A tour of the [facility] is included with this class. $20 per registrant and support person Childbirth Class- Weekend Option: This class is a Weekend version of our Birth & Baby series. It is designed for parents who have a difficult time fitting several weeks of classes into their schedule. It covers the care of your newborn and the basics of labor and childbirth. It also includes a [place]  [facility] and lunch. The class is held two consecutive days: beginning on Friday evening from 6:30 - 8:30 p.m. and the next day, Saturday from 9 a.m. - 4 p.m. (call for fee) Doren Custard Class: Interested in a waterbirth?  This informational class will help you discover whether waterbirth is the right fit for you. Education about waterbirth itself, supplies you would need and how to assemble your support team is what you can expect from this class. Some obstetrical practices require this class in order to pursue a waterbirth. (Not all obstetrical practices offer waterbirth-check with your healthcare provider.) Register only the expectant mom, but you are encouraged to bring your partner to class! Required if planning waterbirth, no fee. Infant/Child CPR: Parents, grandparents, babysitters, and friends learn Cardio-Pulmonary Resuscitation skills for infants and children. You will also learn how to treat both conscious and unconscious choking in infants and children. This Family & Friends program does not offer certification. Register each participant individually to ensure that enough mannequins are available. (Call for fee) Grandparent Love: Expecting a grandbaby? This class is for you! Learn about the latest infant care and safety recommendations and ways to support your own child  as he or she transitions into the parenting role. Taught by Registered Nurses who are childbirth instructors, but most importantly...they are grandmothers too! $10/person. Childbirth Class- Natural Childbirth: This series of 5 weekly classes is for expectant parents who want to learn and practice natural methods of coping with the process of labor and childbirth. Relaxation, breathing, massage, visualization, role of the partner, and helpful positioning are highlighted. Participants learn how to be confident in their body's ability to give birth. This class will empower and help parents make informed decisions about their own  care. Includes discussion that will help new parents transition into the immediate postpartum period. Maternity Care Center Tour of [facility] is included. We suggest taking this class between 25-32 weeks, but it's only a recommendation. $75 per registrant and one support person or $30 Medicaid. Childbirth Class- 3 week Series: This option of 3 weekly classes helps you and your labor partner prepare for childbirth. Newborn care, labor & birth, cesarean birth, pain management, and comfort techniques are discussed and a Maternity Care Center Tour of [facility] is included. The class meets at the same time, on the same day of the week for 3 consecutive weeks beginning with the starting date you choose. $60 for registrant and one support person.  Marvelous Multiples: Expecting twins, triplets, or more? This class covers the differences in labor, birth, parenting, and breastfeeding issues that face multiples' parents. NICU tour is included. Led by a Certified Childbirth Educator who is the mother of twins. No fee. Caring for Baby: This class is for expectant and adoptive parents who want to learn and practice the most up-to-date newborn care for their babies. Focus is on birth through the first six weeks of life. Topics include feeding, bathing, diapering, crying, umbilical cord care, circumcision care and safe sleep. Parents learn to recognize symptoms of illness and when to call the pediatrician. Register only the mom-to-be and your partner or support person can plan to come with you! $10 per registrant and support person Childbirth Class- online option: This online class offers you the freedom to complete a Birth and Baby series in the comfort of your own home. The flexibility of this option allows you to review sections at your own pace, at times convenient to you and your support people. It includes additional video information, animations, quizzes, and extended activities. Get organized with helpful  eClass tools, checklists, and trackers. Once you register online for the class, you will receive an email within a few days to accept the invitation and begin the class when the time is right for you. The content will be available to you for 60 days. $60 for 60 days of online access for you and your support people.  Safe Medications in Pregnancy   Acne:  Benzoyl Peroxide  Salicylic Acid   Backache/Headache:  Tylenol: 2 regular strength every 4 hours OR        2 Extra strength every 6 hours   Colds/Coughs/Allergies:  Benadryl (alcohol free) 25 mg every 6 hours as needed  Breath right strips  Claritin  Cepacol throat lozenges  Chloraseptic throat spray  Cold-Eeze- up to three times per day  Cough drops, alcohol free  Flonase (by prescription only)  Guaifenesin  Mucinex  Robitussin DM (plain only, alcohol free)  Saline nasal spray/drops  Sudafed (pseudoephedrine) & Actifed * use only after [redacted] weeks gestation and if you do not have high blood pressure  Tylenol  Vicks Vaporub  Zinc lozenges  Zyrtec  Constipation:  Colace  Ducolax suppositories  Fleet enema  Glycerin suppositories  Metamucil  Milk of magnesia  Miralax  Senokot  Smooth move tea   Diarrhea:  Kaopectate  Imodium A-D   *NO pepto Bismol   Hemorrhoids:  Anusol  Anusol HC  Preparation H  Tucks   Indigestion:  Tums  Maalox  Mylanta  Zantac  Pepcid   Insomnia:  Benadryl (alcohol free)  every 6 hours as needed  Tylenol PM  Unisom, no Gelcaps   Leg Cramps:  Tums  MagGel   Nausea/Vomiting:  Bonine  Dramamine  Emetrol  Ginger extract  Sea bands  Meclizine  Nausea medication to take during pregnancy:  Unisom (doxylamine succinate 25 mg tablets) Take one tablet daily at bedtime. If symptoms are not adequately controlled, the dose can be increased to a maximum recommended dose of two tablets daily (1/2 tablet in the morning, 1/2 tablet mid-afternoon and one at bedtime).  Vitamin B6   tablets. Take one tablet twice a day (up to 200 mg per day).   Skin Rashes:  Aveeno products  Benadryl cream or  every 6 hours as needed  Calamine Lotion  1% cortisone cream   Yeast infection:  Gyne-lotrimin 7  Monistat 7    **If taking multiple medications, please check labels to avoid duplicating the same active ingredients  **take medication as directed on the label  ** Do not exceed 4000 mg of tylenol in 24 hours  **Do not take medications that contain aspirin or ibuprofen            Diet for N/V in Pregnancy Hyperemesis gravidarum is a severe form of nausea and vomiting that happens during pregnancy. Hyperemesis is worse than morning sickness. It may cause you to have nausea or vomiting all day for many days. It may keep you from eating and drinking enough food and liquids, which can lead to dehydration, malnutrition, and weight loss. Hyperemesis usually occurs during the first half (the first 20 weeks) of pregnancy. It often goes away once a woman is in her second half of pregnancy. However, sometimes hyperemesis continues through an entire pregnancy. What are the causes? The cause of this condition is not known. It may be related to changes in chemicals (hormones) in the body during pregnancy, such as the high level of pregnancy hormone (human chorionic gonadotropin) or the increase in the female sex hormone (estrogen). What are the signs or symptoms? Symptoms of this condition include:  Nausea that does not go away.  Vomiting that does not allow you to keep any food down.  Weight loss.  Body fluid loss (dehydration).  Having no desire to eat, or not liking food that you have previously enjoyed. How is this diagnosed? This condition may be diagnosed based on:  A physical exam.  Your medical history.  Your symptoms.  Blood tests.  Urine tests. How is this treated? This condition is managed by controlling symptoms. This may include:  Following  an eating plan. This can help lessen nausea and vomiting.  Taking prescription medicines. An eating plan and medicines are often used together to help control symptoms. If medicines do not help relieve nausea and vomiting, you may need to receive fluids through an IV at the hospital. Follow these instructions at home: Eating and drinking   Avoid the following: ? Drinking fluids with meals. Try not to drink anything during the 30 minutes before and after your meals. ? Drinking more than 1 cup of fluid at  a time. ? Eating foods that trigger your symptoms. These may include spicy foods, coffee, high-fat foods, very sweet foods, and acidic foods. ? Skipping meals. Nausea can be more intense on an empty stomach. If you cannot tolerate food, do not force it. Try sucking on ice chips or other frozen items and make up for missed calories later. ? Lying down within 2 hours after eating. ? Being exposed to environmental triggers. These may include food smells, smoky rooms, closed spaces, rooms with strong smells, warm or humid places, overly loud and noisy rooms, and rooms with motion or flickering lights. Try eating meals in a well-ventilated area that is free of strong smells. ? Quick and sudden changes in your movement. ? Taking iron pills and multivitamins that contain iron. If you take prescription iron pills, do not stop taking them unless your health care provider approves. ? Preparing food. The smell of food can spoil your appetite or trigger nausea.  To help relieve your symptoms: ? Listen to your body. Everyone is different and has different preferences. Find what works best for you. ? Eat and drink slowly. ? Eat 5-6 small meals daily instead of 3 large meals. Eating small meals and snacks can help you avoid an empty stomach. ? In the morning, before getting out of bed, eat a couple of crackers to avoid moving around on an empty stomach. ? Try eating starchy foods as these are usually  tolerated well. Examples include cereal, toast, bread, potatoes, pasta, rice, and pretzels. ? Include at least 1 serving of protein with your meals and snacks. Protein options include lean meats, poultry, seafood, beans, nuts, nut butters, eggs, cheese, and yogurt. ? Try eating a protein-rich snack before bed. Examples of a protein-rick snack include cheese and crackers or a peanut butter sandwich made with 1 slice of whole-wheat bread and 1 tsp (5 g) of peanut butter. ? Eat or suck on things that have ginger in them. It may help relieve nausea. Add  tsp ground ginger to hot tea or choose ginger tea. ? Try drinking 100% fruit juice or an electrolyte drink. An electrolyte drink contains sodium, potassium, and chloride. ? Drink fluids that are cold, clear, and carbonated or sour. Examples include lemonade, ginger ale, lemon-lime soda, ice water, and sparkling water. ? Brush your teeth or use a mouth rinse after meals. ? Talk with your health care provider about starting a supplement of vitamin B6. General instructions  Take over-the-counter and prescription medicines only as told by your health care provider.  Follow instructions from your health care provider about eating or drinking restrictions.  Continue to take your prenatal vitamins as told by your health care provider. If you are having trouble taking your prenatal vitamins, talk with your health care provider about different options.  Keep all follow-up and pre-birth (prenatal) visits as told by your health care provider. This is important. Contact a health care provider if:  You have pain in your abdomen.  You have a severe headache.  You have vision problems.  You are losing weight.  You feel weak or dizzy. Get help right away if:  You cannot drink fluids without vomiting.  You vomit blood.  You have constant nausea and vomiting.  You are very weak.  You faint.  You have a fever and your symptoms suddenly get  worse. Summary  Hyperemesis gravidarum is a severe form of nausea and vomiting that happens during pregnancy.  Making some changes to your eating habits  may help relieve nausea and vomiting.  This condition may be managed with medicine.  If medicines do not help relieve nausea and vomiting, you may need to receive fluids through an IV at the hospital. This information is not intended to replace advice given to you by your health care provider. Make sure you discuss any questions you have with your health care provider. Document Revised: 05/17/2017 Document Reviewed: 12/25/2015 Elsevier Patient Education  2020 [employer].  First Trimester of Pregnancy  The first trimester of pregnancy is from week 1 until the end of week 13 (months 1 through 3). During this time, your baby will begin to develop inside you. At 6-8 weeks, the eyes and face are formed, and the heartbeat can be seen on ultrasound. At the end of 12 weeks, all the baby's organs are formed. Prenatal care is all the medical care you receive before the birth of your baby. Make sure you get good prenatal care and follow all of your doctor's instructions. Follow these instructions at home: Medicines  Take over-the-counter and prescription medicines only as told by your doctor. Some medicines are safe and some medicines are not safe during pregnancy.  Take a prenatal vitamin that contains at least 600 micrograms (mcg) of folic acid.  If you have trouble pooping (constipation), take medicine that will make your stool soft (stool softener) if your doctor approves. Eating and drinking   Eat regular, healthy meals.  Your doctor will tell you the amount of weight gain that is right for you.  Avoid raw meat and uncooked cheese.  If you feel sick to your stomach (nauseous) or throw up (vomit): ? Eat 4 or 5 small meals a day instead of 3 large meals. ? Try eating a few soda crackers. ? Drink liquids between meals instead of during  meals.  To prevent constipation: ? Eat foods that are high in fiber, like fresh fruits and vegetables, whole grains, and beans. ? Drink enough fluids to keep your pee (urine) clear or pale yellow. Activity  Exercise only as told by your doctor. Stop exercising if you have cramps or pain in your lower belly (abdomen) or low back.  Do not exercise if it is too hot, too humid, or if you are in a place of great height (high altitude).  Try to avoid standing for long periods of time. Move your legs often if you must stand in one place for a long time.  Avoid heavy lifting.  Wear low-heeled shoes. Sit and stand up straight.  You can have sex unless your doctor tells you not to. Relieving pain and discomfort  Wear a good support bra if your breasts are sore.  Take warm water baths (sitz baths) to soothe pain or discomfort caused by hemorrhoids. Use hemorrhoid cream if your doctor says it is okay.  Rest with your legs raised if you have leg cramps or low back pain.  If you have puffy, bulging veins (varicose veins) in your legs: ? Wear support hose or compression stockings as told by your doctor. ? Raise (elevate) your feet for 15 minutes, 3-4 times a day. ? Limit salt in your food. Prenatal care  Schedule your prenatal visits by the twelfth week of pregnancy.  Write down your questions. Take them to your prenatal visits.  Keep all your prenatal visits as told by your doctor. This is important. Safety  Wear your seat belt at all times when driving.  Make a list of emergency phone numbers.  The list should include numbers for family, friends, the hospital, and police and fire departments. General instructions  Ask your doctor for a referral to a local prenatal class. Begin classes no later than at the start of month 6 of your pregnancy.  Ask for help if you need counseling or if you need help with nutrition. Your doctor can give you advice or tell you where to go for help.  Do  not use hot tubs, steam rooms, or saunas.  Do not douche or use tampons or scented sanitary pads.  Do not cross your legs for long periods of time.  Avoid all herbs and alcohol. Avoid drugs that are not approved by your doctor.  Do not use any tobacco products, including cigarettes, chewing tobacco, and electronic cigarettes. If you need help quitting, ask your doctor. You may get counseling or other support to help you quit.  Avoid cat litter boxes and soil used by cats. These carry germs that can cause birth defects in the baby and can cause a loss of your baby (miscarriage) or stillbirth.  Visit your dentist. At home, brush your teeth with a soft toothbrush. Be gentle when you floss. Contact a doctor if:  You are dizzy.  You have mild cramps or pressure in your lower belly.  You have a nagging pain in your belly area.  You continue to feel sick to your stomach, you throw up, or you have watery poop (diarrhea).  You have a bad smelling fluid coming from your vagina.  You have pain when you pee (urinate).  You have increased puffiness (swelling) in your face, hands, legs, or ankles. Get help right away if:  You have a fever.  You are leaking fluid from your vagina.  You have spotting or bleeding from your vagina.  You have very bad belly cramping or pain.  You gain or lose weight rapidly.  You throw up blood. It may look like coffee grounds.  You are around people who have [place] measles, fifth disease, or chickenpox.  You have a very bad headache.  You have shortness of breath.  You have any kind of trauma, such as from a fall or a car accident. Summary  The first trimester of pregnancy is from week 1 until the end of week 13 (months 1 through 3).  To take care of yourself and your unborn baby, you will need to eat healthy meals, take medicines only if your doctor tells you to do so, and do activities that are safe for you and your baby.  Keep all follow-up  visits as told by your doctor. This is important as your doctor will have to ensure that your baby is healthy and growing well. This information is not intended to replace advice given to you by your health care provider. Make sure you discuss any questions you have with your health care provider. Document Revised: 08/18/2018 Document Reviewed: 05/05/2016 Elsevier Patient Education  2020 [employer].

## 2020-01-06 LAB — CBC/D/PLT+RPR+RH+ABO+RUB AB...
Antibody Screen: NEGATIVE
Basophils Absolute: 0.1 10*3/uL (ref 0.0–0.2)
Basos: 1 %
EOS (ABSOLUTE): 0.3 10*3/uL (ref 0.0–0.4)
Eos: 3 %
HCV Ab: <0.1 s/co ratio (ref 0.0–0.9)
HIV Screen 4th Generation wRfx: NONREACTIVE
Hematocrit: 38.2 % (ref 34.0–46.6)
Hemoglobin: 12.5 g/dL (ref 11.1–15.9)
Hepatitis B Surface Ag: NEGATIVE
Immature Grans (Abs): 0 10*3/uL (ref 0.0–0.1)
Immature Granulocytes: 0 %
Lymphocytes Absolute: 3 10*3/uL (ref 0.7–3.1)
Lymphs: 39 %
MCH: 26.5 pg — ABNORMAL LOW (ref 26.6–33.0)
MCHC: 32.7 g/dL (ref 31.5–35.7)
MCV: 81 fL (ref 79–97)
Monocytes Absolute: 0.8 10*3/uL (ref 0.1–0.9)
Monocytes: 10 %
Neutrophils Absolute: 3.6 10*3/uL (ref 1.4–7.0)
Neutrophils: 47 %
Platelets: 281 10*3/uL (ref 150–450)
RBC: 4.72 x10E6/uL (ref 3.77–5.28)
RDW: 12.6 % (ref 11.7–15.4)
RPR Ser Ql: NONREACTIVE
Rh Factor: POSITIVE
Rubella Antibodies, IGG: 1.09 index (ref >0.99)
WBC: 7.7 10*3/uL (ref 3.4–10.8)

## 2020-01-06 LAB — HCV INTERPRETATION

## 2020-01-07 LAB — URINE CULTURE, OB REFLEX

## 2020-01-07 LAB — CULTURE, OB URINE

## 2020-01-08 LAB — CERVICOVAGINAL ANCILLARY ONLY
Bacterial Vaginitis (gardnerella): NEGATIVE
Candida Glabrata: NEGATIVE
Candida Vaginitis: NEGATIVE
Chlamydia: NEGATIVE
Comment: NEGATIVE
Comment: NEGATIVE
Comment: NEGATIVE
Comment: NEGATIVE
Comment: NEGATIVE
Comment: NORMAL
Neisseria Gonorrhea: NEGATIVE
Trichomonas: NEGATIVE

## 2020-01-08 NOTE — Progress Notes (Signed)
   PRENATAL VISIT NOTE  Subjective:  Candace Lowe is a 29 y.o. G2P0010 at [redacted]w[redacted]d being seen today for her first prenatal visit for this pregnancy.  She is currently monitored for the following issues for this low-risk pregnancy and has Supervision of other normal pregnancy, antepartum on their problem list.  Patient reports nausea and vomiting, white discharge with itching.  Contractions: Not present. Vag. Bleeding: None.   . Denies leaking of fluid.   She is unsure of MOF. Declines contraception.   The following portions of the patient's history were reviewed and updated as appropriate: allergies, current medications, past family history, past medical history, past social history, past surgical history and problem list.   Objective:   Vitals:   01/05/20 1106  BP: 106/72  Pulse: 72  Weight: 98 lb (44.5 kg)    Fetal Status:          Unable to obtain FHTs with doppler. Beside [place] used to perform limited fetal evaluation. +FM and cardiac activity noted.   General:  Alert, oriented and cooperative. Patient is in no acute distress.  Skin: Skin is warm and dry. No rash noted.   Cardiovascular: Normal heart rate and rhythm noted  Respiratory: Normal respiratory effort, no problems with respiration noted. Clear to auscultation.   Abdomen: Soft, gravid, appropriate for gestational age. Normal bowel sounds. Non-tender. Pain/Pressure: Absent     Pelvic: Cervical exam performed Dilation: Closed Effacement (%): Thick   Normal cervical contour, no lesions. Small amount of thick, white  Discharge noted.  Extremities: Normal range of motion.  Edema: None  Mental Status: Normal mood and affect. Normal behavior. Normal judgment and thought content.   Assessment and Plan:  Pregnancy: G2P0010 at [redacted]w[redacted]d 1. Encounter for supervision of other normal pregnancy in first trimester - [facility] AMB BABYSCRIPTS SCHEDULE OPTIMIZATION - CBC/D/Plt+RPR+Rh+ABO+Rub Ab... - Culture, OB Urine - Cervicovaginal  ancillary only( Sar[facility]) - Had pap smear last year, will obtain records  - Patient and partner unsure about genetic testing. Discussed options and they will consider and decide by next visit.  - Patient is considering WB. Will plan to see CNM throughout pregnancy whenever available.   2. Nausea and vomiting in pregnancy prior to [redacted] weeks gestation - promethazine (PHENERGAN) 25 MG tablet; Take 1 tablet (25 mg total) by mouth every 6 (six) hours as needed for nausea or vomiting.  Dispense: 30 tablet; Refill: 0 - Discussed diet for N/V in pregnancy and provided information on AVS  3. Encounter for fetal anatomic survey - [place] MFM OB COMP + 14 WK; Future  4. [redacted] weeks gestation of pregnancy  Preterm labor/first trimester warning symptoms and general obstetric precautions including but not limited to vaginal bleeding, contractions, leaking of fluid and fetal movement were reviewed in detail with the patient. Please refer to After Visit Summary for other counseling recommendations.   Discussed the normal visit cadence for prenatal care Discussed the nature of our practice with multiple providers including residents and students   Return in about 4 weeks (around 02/02/2020) for LOB, In-Person.  Future Appointments  Date Time Provider Department Center  02/02/2020  9:55 AM Kathlene Cote [facility]  02/27/2020 10:45 AM [facility] US5 [facility]    Vonzella Nipple, PA-C

## 2020-02-02 ENCOUNTER — Ambulatory Visit (INDEPENDENT_AMBULATORY_CARE_PROVIDER_SITE_OTHER): Payer: Medicaid Other | Admitting: Medical

## 2020-02-02 ENCOUNTER — Other Ambulatory Visit: Payer: Self-pay

## 2020-02-02 DIAGNOSIS — Z3480 Encounter for supervision of other normal pregnancy, unspecified trimester: Secondary | ICD-10-CM

## 2020-02-02 NOTE — Patient Instructions (Signed)

## 2020-02-02 NOTE — Progress Notes (Signed)
   PRENATAL VISIT NOTE  Subjective:  Candace Lowe is a 29 y.o. G2P0010 at [redacted]w[redacted]d being seen today for ongoing prenatal care.  She is currently monitored for the following issues for this low-risk pregnancy and has Supervision of other normal pregnancy, antepartum on their problem list.  Patient reports nausea.  Contractions: Not present. Vag. Bleeding: None.  Movement: Absent. Denies leaking of fluid.   The following portions of the patient's history were reviewed and updated as appropriate: allergies, current medications, past family history, past medical history, past social history, past surgical history and problem list.   Objective:   Vitals:   02/02/20 1021  BP: 107/76  Pulse: 99  Weight: 99 lb (44.9 kg)    Fetal Status: Fetal Heart Rate (bpm): 156   Movement: Absent     General:  Alert, oriented and cooperative. Patient is in no acute distress.  Skin: Skin is warm and dry. No rash noted.   Cardiovascular: Normal heart rate noted  Respiratory: Normal respiratory effort, no problems with respiration noted  Abdomen: Soft, gravid, appropriate for gestational age.  Pain/Pressure: Absent     Pelvic: Cervical exam deferred        Extremities: Normal range of motion.  Edema: None  Mental Status: Normal mood and affect. Normal behavior. Normal judgment and thought content.   Assessment and Plan:  Pregnancy: G2P0010 at [redacted]w[redacted]d 1. Supervision of other normal pregnancy, antepartum - Genetic Screening ordered  - Discussed AFP at next visit, patient will consider  - Considering breastfeeding  - Still deciding on Peds, has a few options   Preterm labor symptoms and general obstetric precautions including but not limited to vaginal bleeding, contractions, leaking of fluid and fetal movement were reviewed in detail with the patient. Please refer to After Visit Summary for other counseling recommendations.   Return in about 4 weeks (around 03/01/2020) for LOB, In-Person.  Future  Appointments  Date Time Provider Department Center  02/27/2020 10:45 AM [facility] US[facility]  02/29/2020  9:35 AM Crisoforo [place], Charlesetta Garibaldi, CNM [facility]    Vonzella Nipple, PA-C

## 2020-02-27 ENCOUNTER — Other Ambulatory Visit: Payer: Self-pay | Admitting: *Deleted

## 2020-02-27 ENCOUNTER — Ambulatory Visit: Payer: Medicaid Other | Attending: Medical

## 2020-02-27 ENCOUNTER — Other Ambulatory Visit: Payer: Self-pay

## 2020-02-27 DIAGNOSIS — O4440 Low lying placenta NOS or without hemorrhage, unspecified trimester: Secondary | ICD-10-CM

## 2020-02-27 DIAGNOSIS — Z3689 Encounter for other specified antenatal screening: Secondary | ICD-10-CM | POA: Insufficient documentation

## 2020-02-27 DIAGNOSIS — Z3A19 19 weeks gestation of pregnancy: Secondary | ICD-10-CM | POA: Diagnosis present

## 2020-02-29 ENCOUNTER — Encounter: Payer: Self-pay | Admitting: *Deleted

## 2020-02-29 ENCOUNTER — Other Ambulatory Visit: Payer: Self-pay

## 2020-02-29 ENCOUNTER — Ambulatory Visit (INDEPENDENT_AMBULATORY_CARE_PROVIDER_SITE_OTHER): Payer: Medicaid Other | Admitting: Student

## 2020-02-29 VITALS — BP 101/65 | HR 85 | Wt 106.1 lb

## 2020-02-29 DIAGNOSIS — Z3480 Encounter for supervision of other normal pregnancy, unspecified trimester: Secondary | ICD-10-CM

## 2020-02-29 DIAGNOSIS — Z3A19 19 weeks gestation of pregnancy: Secondary | ICD-10-CM

## 2020-02-29 MED ORDER — BLOOD PRESSURE KIT DEVI: 1.0000 | Freq: Once | 0 refills | Status: AC

## 2020-02-29 NOTE — Progress Notes (Addendum)
° °  PRENATAL VISIT NOTE  Subjective:  Candace Lowe is a 29 y.o. G2P0010 at [redacted]w[redacted]d being seen today for ongoing prenatal care.  She is currently monitored for the following issues for this low-risk pregnancy and has Supervision of other normal pregnancy, antepartum on their problem list.  Patient reports backache and heartburn. Patient reports having a couple episodes of stomach pain at night. She states that her backache has become worse lately and she has been trying to improve her posture. She says that her nausea and vomiting has improved.   Contractions: Not present. Vag. Bleeding: None.  Movement: Absent. Denies leaking of fluid.   The following portions of the patient's history were reviewed and updated as appropriate: allergies, current medications, past family history, past medical history, past social history, past surgical history and problem list.   Objective:   Vitals:   02/29/20 1007  BP: 101/65  Pulse: 85  Weight: 106 lb 1.6 oz (48.1 kg)    Fetal Status: Fetal Heart Rate (bpm): 155   Movement: Absent     General:  Alert, oriented and cooperative. Patient is in no acute distress.  Skin: Skin is warm and dry. No rash noted.   Cardiovascular: Normal heart rate noted  Respiratory: Normal respiratory effort, no problems with respiration noted  Abdomen: Soft, gravid, appropriate for gestational age.  Pain/Pressure: Present     Pelvic: Cervical exam deferred        Extremities: Normal range of motion.  Edema: None  Mental Status: Normal mood and affect. Normal behavior. Normal judgment and thought content.   Assessment and Plan:  Pregnancy: G2P0010 at [redacted]w[redacted]d 1. Supervision of other normal pregnancy, antepartum - Blood Pressure Monitoring (BLOOD PRESSURE KIT) DEVI; 1 Device by Does not apply route once for 1 dose.  Dispense: 1 each; Refill: 0 - AFP, Serum, Open Spina Bifida -discussed diet/lifestyle modifications to improve reflux symptoms  Preterm labor symptoms and  general obstetric precautions including but not limited to vaginal bleeding, contractions, leaking of fluid and fetal movement were reviewed in detail with the patient. Please refer to After Visit Summary for other counseling recommendations.   Discussed options of future water birth for delivery. Educated the patient on exclusion criteria for water birth.   Discussed birth control options after birth, including placement of IUD. Patient is still undecided about future contraceptive use.    Return in about 4 weeks (around 03/28/2020), or LROB on My Chart.  Future Appointments  Date Time Provider [place]  03/25/2020  1:30 PM [facility] NURSE [facility]  03/25/2020  1:45 PM [facility] US4 [facility] [place], Student-PA    Attestation of Supervision of Student:  I confirm that I have verified the information documented in the physician assistant students note and that I have also personally reperformed the history, physical exam and all medical decision making activities.  I have verified that all services and findings are accurately documented in this student's note; and I agree with management and plan as outlined in the documentation. I have also made any necessary editorial changes.  -Patient doing well, discussed WB, information given on classes. Reviewed exclusion criteria for WB.   Starr [place], [place] for [employer], [employer] 02/29/2020 4:00 PM

## 2020-02-29 NOTE — Patient Instructions (Signed)
Considering Waterbirth? Guide for patients at [facility] Why consider waterbirth? . Gentle birth for babies  . Less pain medicine used in labor  . May allow for passive descent/less pushing  . May reduce perineal tears  . More mobility and instinctive maternal position changes  . Increased maternal relaxation  . Reduced blood pressure in labor   Is waterbirth safe? What are the risks of infection, drowning or other complications? . Infection:  . Very low risk (3.7 % for tub vs 4.8% for bed)  . 7 in 8000 waterbirths with documented infection  . Poorly cleaned equipment most common cause  . Slightly lower group B strep transmission rate  . Drowning  . Maternal:  . Very low risk  . Related to seizures or fainting  . Newborn:  . Very low risk. No evidence of increased risk of respiratory problems in multiple large studies  . Physiological protection from breathing under water  . Avoid underwater birth if there are any fetal complications  . Once baby's head is out of the water, keep it out.  . Birth complication  . Some reports of cord trauma, but risk decreased by bringing baby to surface gradually  . No evidence of increased risk of shoulder dystocia. Mothers can usually change positions faster in water than in a bed, possibly aiding the maneuvers to free the shoulder.  ? You must attend a Waterbirth class at [facility] . 3rd Wednesday of every month from 7-9pm  . Free  . Register by calling [phone] or online at [email or web address]  . Bring us the certificate from the class to your prenatal appointment  Meet with a midwife at 36 weeks to see if you can still plan a waterbirth and to sign the consent.   If you plan a waterbirth at [facility], the following purchases are optional: . [place] [employer] . Bathing suit top  . Long-handled mirror  .  Things that would prevent you from having a  waterbirth: . Unknown or Positive COVID-19 diagnosis upon admission to hospital  . Premature, <37wks  . Previous cesarean birth  . Presence of thick meconium-stained fluid  . Multiple gestation (Twins, triplets, etc.)  . Uncontrolled diabetes or gestational diabetes requiring medication  . Hypertension diagnosed in pregnancy or preexisting hypertension (gestational hypertension, preeclampsia, or chronic hypertension) . Heavy vaginal bleeding  . Non-reassuring fetal heart rate  . Active infection (MRSA, etc.). Group B Strep is NOT a contraindication for waterbirth.  . If your labor has to be induced and induction method requires continuous monitoring of the baby's heart rate  . Other risks/issues identified by your obstetrical provider  .  Please remember that birth is unpredictable. Under certain unforeseeable circumstances your provider may advise against giving birth in the tub. These decisions will be made on a case-by-case basis and with the safety of you and your baby as our highest priority.  **Please remember that in order to have a waterbirth, you must test Negative to COVID-19 upon admission to the hospital.**  

## 2020-03-01 ENCOUNTER — Encounter: Payer: Self-pay | Admitting: Medical

## 2020-03-01 DIAGNOSIS — O4442 Low lying placenta NOS or without hemorrhage, second trimester: Secondary | ICD-10-CM | POA: Insufficient documentation

## 2020-03-01 LAB — AFP, SERUM, OPEN SPINA BIFIDA
AFP MoM: 0.6
AFP Value: 43 ng/mL
Gest. Age on Collection Date: 19.2 weeks
Maternal Age At EDD: 27.3 yr
OSBR Risk 1 IN: 10000
Test Results:: NEGATIVE
Weight: 106 [lb_av]

## 2020-03-04 ENCOUNTER — Encounter: Payer: Self-pay | Admitting: *Deleted

## 2020-03-05 ENCOUNTER — Encounter: Payer: Self-pay | Admitting: General Practice

## 2020-03-13 ENCOUNTER — Encounter: Payer: Self-pay | Admitting: *Deleted

## 2020-03-25 ENCOUNTER — Other Ambulatory Visit: Payer: Self-pay | Admitting: *Deleted

## 2020-03-25 ENCOUNTER — Ambulatory Visit: Payer: Medicaid Other | Admitting: *Deleted

## 2020-03-25 ENCOUNTER — Ambulatory Visit: Payer: Medicaid Other | Attending: Obstetrics and Gynecology

## 2020-03-25 ENCOUNTER — Other Ambulatory Visit: Payer: Self-pay

## 2020-03-25 DIAGNOSIS — Z3480 Encounter for supervision of other normal pregnancy, unspecified trimester: Secondary | ICD-10-CM | POA: Diagnosis present

## 2020-03-25 DIAGNOSIS — Z3A22 22 weeks gestation of pregnancy: Secondary | ICD-10-CM

## 2020-03-25 DIAGNOSIS — O4442 Low lying placenta NOS or without hemorrhage, second trimester: Secondary | ICD-10-CM | POA: Diagnosis not present

## 2020-03-25 DIAGNOSIS — O4440 Low lying placenta NOS or without hemorrhage, unspecified trimester: Secondary | ICD-10-CM | POA: Diagnosis present

## 2020-03-25 DIAGNOSIS — Z362 Encounter for other antenatal screening follow-up: Secondary | ICD-10-CM | POA: Diagnosis not present

## 2020-03-27 ENCOUNTER — Telehealth (INDEPENDENT_AMBULATORY_CARE_PROVIDER_SITE_OTHER): Payer: Medicaid Other | Admitting: Student

## 2020-03-27 DIAGNOSIS — O4442 Low lying placenta NOS or without hemorrhage, second trimester: Secondary | ICD-10-CM

## 2020-03-27 DIAGNOSIS — Z3A23 23 weeks gestation of pregnancy: Secondary | ICD-10-CM

## 2020-03-27 DIAGNOSIS — Z3480 Encounter for supervision of other normal pregnancy, unspecified trimester: Secondary | ICD-10-CM

## 2020-03-27 NOTE — Progress Notes (Signed)
Patient ID: Candace Lowe, female   DOB: [birth year], 29 y.o.   MRN: [id number] I connected with Candace Lowe 03/27/20 at  2:15 PM EST by: MyChart video and verified that I am speaking with the correct person using two identifiers.  Patient is located at home and provider is located at home.     The purpose of this virtual visit is to provide medical care while limiting exposure to the novel coronavirus. I discussed the limitations, risks, security and privacy concerns of performing an evaluation and management service by MyChart video and the availability of in person appointments. I also discussed with the patient that there may be a patient responsible charge related to this service. By engaging in this virtual visit, you consent to the provision of healthcare.  Additionally, you authorize for your insurance to be billed for the services provided during this visit.  The patient expressed understanding and agreed to proceed.  The following staff members participated in the virtual visit:  Rosalva Ferron    PRENATAL VISIT NOTE  Subjective:  Candace Lowe is a 29 y.o. G2P0010 at [redacted]w[redacted]d  for phone visit for ongoing prenatal care.  She is currently monitored for the following issues for this low-risk pregnancy and has Supervision of other normal pregnancy, antepartum and Low-lying placenta in second trimester on their problem list.  Patient reports no complaints.  Contractions: Not present. Vag. Bleeding: None.  Movement: Present. Denies leaking of fluid.   The following portions of the patient's history were reviewed and updated as appropriate: allergies, current medications, past family history, past medical history, past social history, past surgical history and problem list.   Objective:  There were no vitals filed for this visit. Self-Obtained  Fetal Status:     Movement: Present     Assessment and Plan:  Pregnancy: G2P0010 at [redacted]w[redacted]d 1. Low-lying placenta in second  trimester -still low-lying after appt on 11-15; will follow up in January. Reassurance given.   2. Supervision of other normal pregnancy, antepartum -anticipatory guidance given on the upcoming 2 hour GTT, including wait time, fasting, etc.  -patient will go and get BP cuff; reviewed importance of checking BP and warning signs/values  Preterm labor symptoms and general obstetric precautions including but not limited to vaginal bleeding, contractions, leaking of fluid and fetal movement were reviewed in detail with the patient.  Return in about 4 weeks (around 04/24/2020), or LROB in person and 2 hour gtt-patient prefers monday or friday mornings.  Future Appointments  Date Time Provider Department Center  04/12/2020  1:15 PM [facility] NURSE [facility]  05/13/2020  1:30 PM [facility]     Time spent on virtual visit: 15 minutes  Marylene Land, CNM

## 2020-03-27 NOTE — Patient Instructions (Addendum)
Glucose Tolerance Test During Pregnancy Why am I having this test? The glucose tolerance test (GTT) is done to check how your body processes sugar (glucose). This is one of several tests used to diagnose diabetes that develops during pregnancy (gestational diabetes mellitus). Gestational diabetes is a temporary form of diabetes that some women develop during pregnancy. It usually occurs during the second trimester of pregnancy and goes away after delivery. Testing (screening) for gestational diabetes usually occurs between 24 and 28 weeks of pregnancy. You may have the GTT test after having a 1-hour glucose screening test if the results from that test indicate that you may have gestational diabetes. You may also have this test if:  You have a history of gestational diabetes.  You have a history of giving birth to very large babies or have experienced repeated fetal loss (stillbirth).  You have signs and symptoms of diabetes, such as: ? Changes in your vision. ? Tingling or numbness in your hands or feet. ? Changes in hunger, thirst, and urination that are not otherwise explained by your pregnancy. What is being tested? This test measures the amount of glucose in your blood at different times during a period of 3 hours. This indicates how well your body is able to process glucose. What kind of sample is taken?  Blood samples are required for this test. They are usually collected by inserting a needle into a blood vessel. How do I prepare for this test?  For 3 days before your test, eat normally. Have plenty of carbohydrate-rich foods.  Follow instructions from your health care provider about: ? Eating or drinking restrictions on the day of the test. You may be asked to not eat or drink anything other than water (fast) starting 8-10 hours before the test. ? Changing or stopping your regular medicines. Some medicines may interfere with this test. Tell a health care provider about:  All  medicines you are taking, including vitamins, herbs, eye drops, creams, and over-the-counter medicines.  Any blood disorders you have.  Any surgeries you have had.  Any medical conditions you have. What happens during the test? First, your blood glucose will be measured. This is referred to as your fasting blood glucose, since you fasted before the test. Then, you will drink a glucose solution that contains a certain amount of glucose. Your blood glucose will be measured again 1, 2, and 3 hours after drinking the solution. This test takes about 3 hours to complete. You will need to stay at the testing location during this time. During the testing period:  Do not eat or drink anything other than the glucose solution.  Do not exercise.  Do not use any products that contain nicotine or tobacco, such as cigarettes and e-cigarettes. If you need help stopping, ask your health care provider. The testing procedure may vary among health care providers and hospitals. How are the results reported? Your results will be reported as milligrams of glucose per deciliter of blood (mg/dL) or millimoles per liter (mmol/L). Your health care provider will compare your results to normal ranges that were established after testing a large group of people (reference ranges). Reference ranges may vary among labs and hospitals. For this test, common reference ranges are:  Fasting: less than 95-105 mg/dL (5.3-5.8 mmol/L).  1 hour after drinking glucose: less than 180-190 mg/dL (10.0-10.5 mmol/L).  2 hours after drinking glucose: less than 155-165 mg/dL (8.6-9.2 mmol/L).  3 hours after drinking glucose: 140-145 mg/dL (7.8-8.1 mmol/L). What do the  results mean? Results within reference ranges are considered normal, meaning that your glucose levels are well-controlled. If two or more of your blood glucose levels are high, you may be diagnosed with gestational diabetes. If only one level is high, your health care  provider may suggest repeat testing or other tests to confirm a diagnosis. Talk with your health care provider about what your results mean. Questions to ask your health care provider Ask your health care provider, or the department that is doing the test:  When will my results be ready?  How will I get my results?  What are my treatment options?  What other tests do I need?  What are my next steps? Summary  The glucose tolerance test (GTT) is one of several tests used to diagnose diabetes that develops during pregnancy (gestational diabetes mellitus). Gestational diabetes is a temporary form of diabetes that some women develop during pregnancy.  You may have the GTT test after having a 1-hour glucose screening test if the results from that test indicate that you may have gestational diabetes. You may also have this test if you have any symptoms or risk factors for gestational diabetes.  Talk with your health care provider about what your results mean. This information is not intended to replace advice given to you by your health care provider. Make sure you discuss any questions you have with your health care provider. Document Revised: 08/18/2018 Document Reviewed: 12/07/2016 Elsevier Patient Education  2020 [employer].  Considering [place]? Guide for patients at Center for [facility]) Why consider waterbirth? . Gentle birth for babies  . Less pain medicine used in labor  . May allow for passive descent/less pushing  . May reduce perineal tears  . More mobility and instinctive maternal position changes  . Increased maternal relaxation     Is waterbirth safe? What are the risks of infection, drowning or other complications? . Infection:  Marland Kitchen Very low risk (3.7 % for tub vs 4.8% for bed)  . 7 in 8000 waterbirths with documented infection  . Poorly cleaned equipment most common cause  . Slightly lower group B strep transmission rate  . Drowning  . Maternal:   . Very low risk  . Related to seizures or fainting  . Newborn:  Marland Kitchen Very low risk. No evidence of increased risk of respiratory problems in multiple large studies  . Physiological protection from breathing under water  . Avoid underwater birth if there are any fetal complications  . Once baby's head is out of the water, keep it out.  . Birth complication  . Some reports of cord trauma, but risk decreased by bringing baby to surface gradually  . No evidence of increased risk of shoulder dystocia. Mothers can usually change positions faster in water than in a bed, possibly aiding the maneuvers to free the shoulder.   There are 2 things you MUST do to have a waterbirth with [facility]: 1. Attend a waterbirth class at [facility] at [facility]   a. 3rd Wednesday of every month from 7-9 pm (virtual during COVID) b. Free c. Register by calling [phone] or register online at [email or web address] d. Bring [place] the certificate from the class to your prenatal appointment or send via MyChart 2. Meet with a midwife at 36 weeks* to see if you can still plan a waterbirth and to sign the consent.   *We also recommend that you schedule as many of your prenatal visits with a midwife as  possible.    Helpful information: . You may want to bring a bathing suit top to the hospital to wear during labor but this is optional.  All other supplies are provided by the hospital. . Please arrive at the hospital with signs of active labor, and do not wait at home until late in labor. It takes 45 min- 2 hours for COVID testing, fetal monitoring, and check in to your room to take place, plus transport and filling of the waterbirth tub.    Things that would prevent you from having a waterbirth: . Unknown or Positive COVID-19 diagnosis upon admission to hospital* . Premature, <37wks  . Previous cesarean birth  . Presence of thick meconium-stained fluid  . Multiple gestation (Twins, triplets, etc.)   . Uncontrolled diabetes or gestational diabetes requiring medication  . Hypertension diagnosed in pregnancy or preexisting hypertension (gestational hypertension, preeclampsia, or chronic hypertension) . Heavy vaginal bleeding  . Non-reassuring fetal heart rate  . Active infection (MRSA, etc.). Group B Strep is NOT a contraindication for waterbirth.  . If your labor has to be induced and induction method requires continuous monitoring of the baby's heart rate  . Other risks/issues identified by your obstetrical provider   Please remember that birth is unpredictable. Under certain unforeseeable circumstances your provider may advise against giving birth in the tub. These decisions will be made on a case-by-case basis and with the safety of you and your baby as our highest priority.   *Please remember that in order to have a waterbirth, you must test Negative to COVID-19 upon admission to the hospital.  Updated 03/26/2020

## 2020-03-27 NOTE — Progress Notes (Signed)
2:08p- Called Pt for My Chart visit, no answer, left VM that we will retry in 10 to 15 minutes.

## 2020-04-12 ENCOUNTER — Ambulatory Visit: Payer: Medicaid Other

## 2020-05-11 NOTE — L&D Delivery Note (Addendum)
Delivery Note Pt pushed x 36 minutes. Difficult to trace FHR due to pt pushing monitor away. Moderate variability. Possible variable decels. Vs tracing MHR. At 7:40 PM a viable and healthy female was delivered via Vaginal, Spontaneous (Presentation: Left Occiput Anterior).  Compound left arm. No difficulty w/ delivery of shoulders. After delay of 1 minute cord clamped x 2 and cut by partner. APGAR: 9, 9; weight pending.   Placenta status: Spontaneous, Intact.  Cord: 3 vessels with the following complications: None.  Cord pH: NA  Anesthesia: Local and IV Fentanyl and Dilaudid.  Episiotomy: None Lacerations: Small 1st degree perineal and left labial lacerations.  Suture Repair: 3.0 vicryl rapide Est. Blood Loss (mL): 340  Mom to postpartum.  Baby to Couplet care / Skin to Skin. Repeat UPC 1.13. Meet criteria for Mild Pre-Eclampsia. Magnesium not indicated.   Dorathy Kinsman 07/14/2020, 8:29 PM

## 2020-05-13 ENCOUNTER — Other Ambulatory Visit: Payer: Self-pay

## 2020-05-13 ENCOUNTER — Ambulatory Visit: Payer: Medicaid Other | Attending: Obstetrics

## 2020-05-13 ENCOUNTER — Encounter: Payer: Self-pay | Admitting: *Deleted

## 2020-05-13 ENCOUNTER — Ambulatory Visit: Payer: Medicaid Other | Admitting: *Deleted

## 2020-05-13 DIAGNOSIS — O4443 Low lying placenta NOS or without hemorrhage, third trimester: Secondary | ICD-10-CM | POA: Diagnosis not present

## 2020-05-13 DIAGNOSIS — O4440 Low lying placenta NOS or without hemorrhage, unspecified trimester: Secondary | ICD-10-CM | POA: Diagnosis not present

## 2020-05-13 DIAGNOSIS — O4442 Low lying placenta NOS or without hemorrhage, second trimester: Secondary | ICD-10-CM

## 2020-05-13 DIAGNOSIS — Z3480 Encounter for supervision of other normal pregnancy, unspecified trimester: Secondary | ICD-10-CM | POA: Insufficient documentation

## 2020-05-13 DIAGNOSIS — Z3A29 29 weeks gestation of pregnancy: Secondary | ICD-10-CM | POA: Diagnosis not present

## 2020-05-13 DIAGNOSIS — Z362 Encounter for other antenatal screening follow-up: Secondary | ICD-10-CM | POA: Diagnosis not present

## 2020-05-14 ENCOUNTER — Other Ambulatory Visit: Payer: Self-pay

## 2020-05-14 DIAGNOSIS — Z3480 Encounter for supervision of other normal pregnancy, unspecified trimester: Secondary | ICD-10-CM

## 2020-05-15 ENCOUNTER — Other Ambulatory Visit: Payer: Medicaid Other

## 2020-05-15 DIAGNOSIS — Z3480 Encounter for supervision of other normal pregnancy, unspecified trimester: Secondary | ICD-10-CM

## 2020-05-16 ENCOUNTER — Encounter: Payer: Self-pay | Admitting: Obstetrics and Gynecology

## 2020-05-16 ENCOUNTER — Other Ambulatory Visit: Payer: Self-pay

## 2020-05-16 ENCOUNTER — Telehealth: Payer: Self-pay

## 2020-05-16 DIAGNOSIS — O24419 Gestational diabetes mellitus in pregnancy, unspecified control: Secondary | ICD-10-CM

## 2020-05-16 LAB — CBC
Hematocrit: 32.1 % — ABNORMAL LOW (ref 34.0–46.6)
Hemoglobin: 10.4 g/dL — ABNORMAL LOW (ref 11.1–15.9)
MCH: 26.5 pg — ABNORMAL LOW (ref 26.6–33.0)
MCHC: 32.4 g/dL (ref 31.5–35.7)
MCV: 82 fL (ref 79–97)
Platelets: 299 10*3/uL (ref 150–450)
RBC: 3.93 x10E6/uL (ref 3.77–5.28)
RDW: 12.9 % (ref 11.7–15.4)
WBC: 8.7 10*3/uL (ref 3.4–10.8)

## 2020-05-16 LAB — GLUCOSE TOLERANCE, 2 HOURS W/ 1HR
Glucose, 1 hour: 181 mg/dL — ABNORMAL HIGH (ref 65–179)
Glucose, 2 hour: 129 mg/dL (ref 65–152)
Glucose, Fasting: 74 mg/dL (ref 65–91)

## 2020-05-16 LAB — HIV ANTIBODY (ROUTINE TESTING W REFLEX): HIV Screen 4th Generation wRfx: NONREACTIVE

## 2020-05-16 LAB — RPR: RPR Ser Ql: NONREACTIVE

## 2020-05-16 MED ORDER — ACCU-CHEK SOFTCLIX LANCETS MISC: 12 refills | Status: DC

## 2020-05-16 MED ORDER — GLUCOSE BLOOD VI STRP: ORAL_STRIP | 12 refills | Status: DC

## 2020-05-16 MED ORDER — ACCU-CHEK GUIDE W/DEVICE KIT: 1.0000 | PACK | Freq: Four times a day (QID) | 0 refills | Status: DC

## 2020-05-16 NOTE — Telephone Encounter (Signed)
Called Pt to advise of positive Glucose test, no answer & mailbox full. Will send message via My Chart. Supplies has been ordered & referral for Nutrition/ Diabetes has been put in.

## 2020-05-16 NOTE — Telephone Encounter (Signed)
-----   Message from Hermina Staggers, MD sent at 05/16/2020  9:21 AM EST ----- Please let pt know that she failed her glucola test and has GDM. Please make referral to DM educator and order all necessary diabetic supplies for the pt.  Thanks Casimiro Needle

## 2020-05-22 ENCOUNTER — Encounter: Payer: Self-pay | Admitting: Obstetrics and Gynecology

## 2020-05-22 ENCOUNTER — Other Ambulatory Visit: Payer: Self-pay

## 2020-05-22 ENCOUNTER — Ambulatory Visit (INDEPENDENT_AMBULATORY_CARE_PROVIDER_SITE_OTHER): Payer: Medicaid Other | Admitting: Obstetrics and Gynecology

## 2020-05-22 VITALS — BP 123/83 | HR 104 | Wt 118.4 lb

## 2020-05-22 DIAGNOSIS — O4442 Low lying placenta NOS or without hemorrhage, second trimester: Secondary | ICD-10-CM

## 2020-05-22 DIAGNOSIS — Z3480 Encounter for supervision of other normal pregnancy, unspecified trimester: Secondary | ICD-10-CM

## 2020-05-22 DIAGNOSIS — O24410 Gestational diabetes mellitus in pregnancy, diet controlled: Secondary | ICD-10-CM

## 2020-05-22 NOTE — Patient Instructions (Signed)
Gestational Diabetes Mellitus, Diagnosis Gestational diabetes mellitus is a form of diabetes. It can happen when you are pregnant. The diabetes goes away after you give birth. If you do not get treated for this condition, it may cause problems for you or your baby. What are the causes? This condition is caused by changes in your body when you are pregnant. When these happen:  A part of the body called the pancreas does not make enough insulin.  The body cannot use insulin in the right way. Sugars cannot get into cells in your body. The sugars stay in your blood. This leads to high blood sugar.   What increases the risk?  Being older than age 25 when pregnant.  Having someone with diabetes in your family.  Too much body weight.  Having had this condition in the past.  Polycystic ovary syndrome.  Being pregnant with more than one baby. What are the signs or symptoms?  Being thirsty often.  Being hungry often.  Needing to pee more often. How is this treated?  Eat a healthy diet.  Get more exercise.  Check your blood sugar often.  Take insulin and other medicines, if needed.  Work with an expert on this condition, if told. Follow these instructions at home: Learn about your diabetes Ask your doctor:  How often should I check my blood sugar? Where do I get the equipment?  What medicines do I need? When should I take them?  Do I need to meet with an educator?  Who can I call if I have questions?  Where can I find a support group? General instructions  Take medicines only as told by your doctor.  Stay at a healthy weight.  Drink enough fluid to keep your pee pale yellow.  Wear an alert bracelet or carry a card that shows you have this condition.  Keep all follow-up visits. Where to find more information  American Diabetes Association (ADA): diabetes.org  Association of Diabetes Care & Education Specialists (ADCES): diabeteseducator.org  Centers for  Disease Control and Prevention (CDC): cdc.gov  American Pregnancy Association: americanpregnancy.org  [place]. Department of Agriculture MyPlate: myplate.gov Contact a doctor if:  Your blood sugar is at or above 240 mg/dL (13.3 mmol/L).  Your blood sugar is at or above 200 mg/dL (11.1 mmol/L) and you have ketones in your pee.  You have a fever.  You are sick for 2 days or more and you do not get better.  You have either of these problems for more than 6 hours: ? You vomit every time you eat or drink. ? You have watery poop (diarrhea). Get help right away if:  You cannot think clearly.  You are not breathing well.  You have a lot of ketones in your pee.  Your baby seems to move less than normal.  Abnormal fluid or blood starts to come out of your vagina.  You start having contractions before your due date. You may feel your belly tighten.  You have a very bad headache. These symptoms may be an emergency. Get help right away. Call your local emergency services (911 in the [place]  Do not wait to see if the symptoms will go away.  Do not drive yourself to the hospital. Summary  Gestational diabetes is a form of diabetes. It can happen when you are pregnant.  This condition occurs when your body cannot make or use insulin in the right way.  Eat a healthy diet, exercise, and use medicines or insulin   as told by your doctor.  Tell your doctor if your blood sugar is high, you have a fever, or you vomit every time you eat or drink.  Get help right away if you cannot think clearly, you are not breathing well, or your baby seems to move less than normal. This information is not intended to replace advice given to you by your health care provider. Make sure you discuss any questions you have with your health care provider. Document Revised: 10/02/2019 Document Reviewed: 10/02/2019 Elsevier Patient Education  2021 Elsevier Inc.  

## 2020-05-22 NOTE — Progress Notes (Signed)
Subjective:  Candace Lowe is a 30 y.o. G2P0010 at [redacted]w[redacted]d being seen today for ongoing prenatal care.  She is currently monitored for the following issues for this high-risk pregnancy and has Supervision of other normal pregnancy, antepartum; Low-lying placenta in second trimester; and Gestational diabetes on their problem list.  Patient reports general discomforts of pregnancy.  Contractions: Not present. Vag. Bleeding: None.  Movement: Present. Denies leaking of fluid.   The following portions of the patient's history were reviewed and updated as appropriate: allergies, current medications, past family history, past medical history, past social history, past surgical history and problem list. Problem list updated.  Objective:   Vitals:   05/22/20 1353  BP: 123/83  Pulse: (!) 104  Weight: 118 lb 6.4 oz (53.7 kg)    Fetal Status: Fetal Heart Rate (bpm): 155   Movement: Present     General:  Alert, oriented and cooperative. Patient is in no acute distress.  Skin: Skin is warm and dry. No rash noted.   Cardiovascular: Normal heart rate noted  Respiratory: Normal respiratory effort, no problems with respiration noted  Abdomen: Soft, gravid, appropriate for gestational age. Pain/Pressure: Present     Pelvic:  Cervical exam deferred        Extremities: Normal range of motion.  Edema: None  Mental Status: Normal mood and affect. Normal behavior. Normal judgment and thought content.   Urinalysis:      Assessment and Plan:  Pregnancy: G2P0010 at [redacted]w[redacted]d  1. Supervision of other normal pregnancy, antepartum Stable Declined Tdap  2. Diet controlled gestational diabetes mellitus (GDM) in third trimester GDM reviewed with pt Importance of CBG control to reduce potential fetal and maternal risks discussed DM educator appt tomorrow  3. Low-lying placenta in second trimester Resolved on last U/S  Preterm labor symptoms and general obstetric precautions including but not limited to  vaginal bleeding, contractions, leaking of fluid and fetal movement were reviewed in detail with the patient. Please refer to After Visit Summary for other counseling recommendations.  Return in about 2 weeks (around 06/05/2020) for OB visit, face to face, MD only.   Hermina Staggers, MD

## 2020-05-23 ENCOUNTER — Encounter: Payer: Medicaid Other | Attending: Obstetrics and Gynecology | Admitting: Registered"

## 2020-05-23 ENCOUNTER — Ambulatory Visit: Payer: Medicaid Other | Admitting: Registered"

## 2020-05-23 DIAGNOSIS — O24419 Gestational diabetes mellitus in pregnancy, unspecified control: Secondary | ICD-10-CM | POA: Diagnosis not present

## 2020-05-23 NOTE — Progress Notes (Signed)
This patient is accompanied in the office by her significant other. Prior to starting the visit they both were asking why she couldn't re-do the test. Pt states she ate some sweet stuff the night before and thinks it affected the test. They report they know of a lot of other people who were given the chance to retake the test and don't understand why she cant. RD sent concerns via email to assistant director, Cathlean Marseilles, RN  Patient was seen on 05/23/20 for Gestational Diabetes self-management. EDD 07/23/20, 31w2s. Patient states no history of GDM. Diet history obtained. Patient eats variety of all food groups. Beverages include water, soda, juice.   The following learning objectives were met by the patient :   States the definition of Gestational Diabetes  States why dietary management is important in controlling blood glucose  Describes the effects of carbohydrates on blood glucose levels  Demonstrates ability to create a balanced meal plan  Demonstrates carbohydrate counting   States when to check blood glucose levels  Demonstrates proper blood glucose monitoring techniques  States the effect of stress and exercise on blood glucose levels  States the importance of limiting caffeine and abstaining from alcohol and smoking  Plan:  Aim for 3 Carbohydrate Choices per meal (45 grams) +/- 1 either way  Aim for 1-2 Carbohydrate Choices per snack Begin reading food labels for Total Carbohydrate of foods If OK with your MD, consider  increasing your activity level by walking, Arm Chair Exercises or other activity daily as tolerated Begin checking Blood Glucose before breakfast and 2 hours after first bite of breakfast, lunch and dinner as directed by MD  Bring Log Book/Sheet and meter to every medical appointment  Baby Scripts: (BS 2.0 not capable of glucose management at this time.) Patient to record blood sugar on glucose log sheet  Take medication if directed by MD  Blood glucose  monitor given: Accu-chek Guide Me Lot #[id number] Exp: 06/10/2021 CBG: 110 mg/dL (1 hrs after sausage, egg, cheese biscuit & apple juice)  Rx order placed for  Accu-chek Guide strips and Softclix lancets  Patient instructed to monitor glucose levels: FBS: 60 - 95 mg/dl 2 hour: <120 mg/dl  Patient received the following handouts:  Nutrition Diabetes and Pregnancy  Carbohydrate Counting List  Blood glucose Log Sheet  Patient will be seen for follow-up as needed.

## 2020-05-29 ENCOUNTER — Other Ambulatory Visit: Payer: Self-pay

## 2020-06-05 ENCOUNTER — Telehealth (INDEPENDENT_AMBULATORY_CARE_PROVIDER_SITE_OTHER): Payer: Medicaid Other | Admitting: Student

## 2020-06-05 VITALS — BP 111/76 | HR 78

## 2020-06-05 DIAGNOSIS — Z3480 Encounter for supervision of other normal pregnancy, unspecified trimester: Secondary | ICD-10-CM

## 2020-06-05 DIAGNOSIS — O4443 Low lying placenta NOS or without hemorrhage, third trimester: Secondary | ICD-10-CM

## 2020-06-05 DIAGNOSIS — O24419 Gestational diabetes mellitus in pregnancy, unspecified control: Secondary | ICD-10-CM

## 2020-06-05 DIAGNOSIS — O4442 Low lying placenta NOS or without hemorrhage, second trimester: Secondary | ICD-10-CM

## 2020-06-05 DIAGNOSIS — Z3A33 33 weeks gestation of pregnancy: Secondary | ICD-10-CM

## 2020-06-05 NOTE — Progress Notes (Signed)
I connected with Candace Lowe 06/05/20 at  2:15 PM EST by: MyChart video and verified that I am speaking with the correct person using two identifiers.  Patient is located at home  and provider is located at [facility].     The purpose of this virtual visit is to provide medical care while limiting exposure to the novel coronavirus. I discussed the limitations, risks, security and privacy concerns of performing an evaluation and management service by MyChart video and the availability of in person appointments. I also discussed with the patient that there may be a patient responsible charge related to this service. By engaging in this virtual visit, you consent to the provision of healthcare.  Additionally, you authorize for your insurance to be billed for the services provided during this visit.  The patient expressed understanding and agreed to proceed.  The following staff members participated in the virtual visit:  Corinda Gubler    PRENATAL VISIT NOTE  Subjective:  Candace Lowe is a 30 y.o. G2P0010 at [redacted]w[redacted]d  for phone visit for ongoing prenatal care.  She is currently monitored for the following issues for this low-risk pregnancy and has Supervision of other normal pregnancy, antepartum; Low-lying placenta in second trimester; and Gestational diabetes on their problem list.  Patient reports no complaints. Reports that her morning sugars are in the 80s; one blood sugar that went up to 100 after eating cookies at 4 am. Reports that her two hours are less than 120. Her boyfriend is keeping track on his phone.  She reports having sugar cravings when she is awake at night (not often, a few times in the past weeks). patiuent also had congestion and sore throat; is planning to get COVID test tonight.   Contractions: Not present. Vag. Bleeding: None.  Movement: Present. Denies leaking of fluid.   The following portions of the patient's history were reviewed and updated as appropriate: allergies, current  medications, past family history, past medical history, past social history, past surgical history and problem list.   Objective:   Vitals:   06/05/20 1508  BP: 111/76  Pulse: 78   Self-Obtained  Fetal Status:     Movement: Present     Assessment and Plan:  Pregnancy: G2P0010 at [redacted]w[redacted]d 1. Low-lying placenta in second trimester   2. Supervision of other normal pregnancy, antepartum -COVID testing tonight; isolation precautions reviewed -keep monitoring blood sugar, please bring log to three week visits -patient needs follow up US for GDM; order is placed and in the check out note -patient still desires waterbirth, explained that we must have record of blood sugars to be sure that her diabetes is well-controlled enough to have waterbirth.  -guidance given for  Preterm labor symptoms and general obstetric precautions including but not limited to vaginal bleeding, contractions, leaking of fluid and fetal movement were reviewed in detail with the patient.  Return in about 3 weeks (around 06/26/2020), or HROB in person at 36 weeks and needs MFM Follow up for growth at 36 weeks.  No future appointments.   Time spent on virtual visit: 20 minutes  Marylene Land, CNM

## 2020-06-05 NOTE — Patient Instructions (Signed)
Considering Waterbirth? Guide for patients at [facility] ([facility]) Why consider waterbirth? . Gentle birth for babies  . Less pain medicine used in labor  . May allow for passive descent/less pushing  . May reduce perineal tears  . More mobility and instinctive maternal position changes  . Increased maternal relaxation   Is waterbirth safe? What are the risks of infection, drowning or other complications? . Infection:  . Very low risk (3.7 % for tub vs 4.8% for bed)  . 7 in 8000 waterbirths with documented infection  . Poorly cleaned equipment most common cause  . Slightly lower group B strep transmission rate  . Drowning  . Maternal:  . Very low risk  . Related to seizures or fainting  . Newborn:  . Very low risk. No evidence of increased risk of respiratory problems in multiple large studies  . Physiological protection from breathing under water  . Avoid underwater birth if there are any fetal complications  . Once baby's head is out of the water, keep it out.  . Birth complication  . Some reports of cord trauma, but risk decreased by bringing baby to surface gradually  . No evidence of increased risk of shoulder dystocia. Mothers can usually change positions faster in water than in a bed, possibly aiding the maneuvers to free the shoulder.   There are 2 things you MUST do to have a waterbirth with [facility]: 1. Attend a waterbirth class at [facility] [place]   a. 3rd Wednesday of every month from 7-9 pm (virtual during COVID) b. Free c. Register by calling [phone] or register online at [email or web address] d. Bring us the certificate from the class to your prenatal appointment or send via MyChart 2. Meet with a midwife at 36 weeks* to see if you can still plan a waterbirth and to sign the consent.   *We also recommend that you schedule as many of your prenatal visits with a midwife as possible.    Helpful information: . You may  want to bring a bathing suit top to the hospital to wear during labor but this is optional.  All other supplies are provided by the hospital. . Please arrive at the hospital with signs of active labor, and do not wait at home until late in labor. It takes 45 min- 2 hours for COVID testing, fetal monitoring, and check in to your room to take place, plus transport and filling of the waterbirth tub.    Things that would prevent you from having a waterbirth: . Unknown or Positive COVID-19 diagnosis upon admission to hospital* . Premature, <37wks  . Previous cesarean birth  . Presence of thick meconium-stained fluid  . Multiple gestation (Twins, triplets, etc.)  . Uncontrolled diabetes or gestational diabetes requiring medication  . Hypertension diagnosed in pregnancy or preexisting hypertension (gestational hypertension, preeclampsia, or chronic hypertension) . Heavy vaginal bleeding  . Non-reassuring fetal heart rate  . Active infection (MRSA, etc.). Group B Strep is NOT a contraindication for waterbirth.  . If your labor has to be induced and induction method requires continuous monitoring of the baby's heart rate  . Other risks/issues identified by your obstetrical provider   Please remember that birth is unpredictable. Under certain unforeseeable circumstances your provider may advise against giving birth in the tub. These decisions will be made on a case-by-case basis and with the safety of you and your baby as our highest priority.   *Please remember that in order   to have a waterbirth, you must test Negative to COVID-19 upon admission to the hospital.  Updated 03/26/2020  

## 2020-06-26 ENCOUNTER — Ambulatory Visit: Payer: Medicaid Other | Admitting: *Deleted

## 2020-06-26 ENCOUNTER — Other Ambulatory Visit: Payer: Self-pay

## 2020-06-26 ENCOUNTER — Encounter: Payer: Medicaid Other | Admitting: Obstetrics & Gynecology

## 2020-06-26 ENCOUNTER — Encounter: Payer: Self-pay | Admitting: *Deleted

## 2020-06-26 ENCOUNTER — Ambulatory Visit: Payer: Medicaid Other | Attending: Student

## 2020-06-26 DIAGNOSIS — O4442 Low lying placenta NOS or without hemorrhage, second trimester: Secondary | ICD-10-CM

## 2020-06-26 DIAGNOSIS — Z3480 Encounter for supervision of other normal pregnancy, unspecified trimester: Secondary | ICD-10-CM | POA: Insufficient documentation

## 2020-06-26 DIAGNOSIS — O352XX0 Maternal care for (suspected) hereditary disease in fetus, not applicable or unspecified: Secondary | ICD-10-CM | POA: Diagnosis not present

## 2020-06-26 DIAGNOSIS — O24410 Gestational diabetes mellitus in pregnancy, diet controlled: Secondary | ICD-10-CM | POA: Diagnosis not present

## 2020-06-26 DIAGNOSIS — Z3A36 36 weeks gestation of pregnancy: Secondary | ICD-10-CM

## 2020-06-26 DIAGNOSIS — O24419 Gestational diabetes mellitus in pregnancy, unspecified control: Secondary | ICD-10-CM | POA: Insufficient documentation

## 2020-06-26 DIAGNOSIS — Z148 Genetic carrier of other disease: Secondary | ICD-10-CM

## 2020-07-02 ENCOUNTER — Other Ambulatory Visit (HOSPITAL_COMMUNITY)
Admission: RE | Admit: 2020-07-02 | Discharge: 2020-07-02 | Disposition: A | Discharge status: Home / Self Care | Payer: Medicaid Other | Source: Ambulatory Visit | Attending: Obstetrics & Gynecology | Admitting: Obstetrics & Gynecology

## 2020-07-02 ENCOUNTER — Other Ambulatory Visit: Payer: Self-pay | Admitting: Family Medicine

## 2020-07-02 ENCOUNTER — Encounter: Payer: Self-pay | Admitting: Family Medicine

## 2020-07-02 ENCOUNTER — Other Ambulatory Visit: Payer: Self-pay

## 2020-07-02 ENCOUNTER — Ambulatory Visit (INDEPENDENT_AMBULATORY_CARE_PROVIDER_SITE_OTHER): Payer: Medicaid Other | Admitting: Family Medicine

## 2020-07-02 VITALS — BP 120/92 | HR 82 | Wt 127.9 lb

## 2020-07-02 DIAGNOSIS — R030 Elevated blood-pressure reading, without diagnosis of hypertension: Secondary | ICD-10-CM

## 2020-07-02 DIAGNOSIS — Z3480 Encounter for supervision of other normal pregnancy, unspecified trimester: Secondary | ICD-10-CM | POA: Diagnosis not present

## 2020-07-02 DIAGNOSIS — O24410 Gestational diabetes mellitus in pregnancy, diet controlled: Secondary | ICD-10-CM

## 2020-07-02 DIAGNOSIS — O4442 Low lying placenta NOS or without hemorrhage, second trimester: Secondary | ICD-10-CM

## 2020-07-02 MED ORDER — POLYETHYLENE GLYCOL 3350 17 GM/SCOOP PO POWD: 17.0000 g | Freq: Every day | ORAL | 1 refills | Status: DC | PRN

## 2020-07-02 NOTE — Progress Notes (Signed)
   Subjective:  Candace Lowe is a 30 y.o. G2P0010 at [redacted]w[redacted]d being seen today for ongoing prenatal care.  She is currently monitored for the following issues for this high-risk pregnancy and has Supervision of other normal pregnancy, antepartum; Low-lying placenta in second trimester; Gestational diabetes; and Low grade squamous intraepithelial lesion on cytologic smear of cervix (LGSIL) on their problem list.  Patient reports no complaints.  Contractions: Not present. Vag. Bleeding: None.  Movement: Present. Denies leaking of fluid.   The following portions of the patient's history were reviewed and updated as appropriate: allergies, current medications, past family history, past medical history, past social history, past surgical history and problem list. Problem list updated.  Objective:   Vitals:   07/02/20 1627  BP: (!) 120/92  Pulse: 82  Weight: 127 lb 14.4 oz (58 kg)    Fetal Status:     Movement: Present     General:  Alert, oriented and cooperative. Patient is in no acute distress.  Skin: Skin is warm and dry. No rash noted.   Cardiovascular: Normal heart rate noted  Respiratory: Normal respiratory effort, no problems with respiration noted  Abdomen: Soft, gravid, appropriate for gestational age. Pain/Pressure: Present     Pelvic: Vag. Bleeding: None     Cervical exam deferred        Extremities: Normal range of motion.  Edema: None  Mental Status: Normal mood and affect. Normal behavior. Normal judgment and thought content.   Urinalysis:      Assessment and Plan:  Pregnancy: G2P0010 at [redacted]w[redacted]d  1. Supervision of other normal pregnancy, antepartum Swabs collected today BP mild range, see below FHR normal Reports significant constipation, miralax sent - Culture, beta strep (group b only) - GC/Chlamydia probe amp ([place])not at [facility]  2. Diet controlled gestational diabetes mellitus (GDM) in third trimester See media tab for sugar log Well controlled Growth  scan on 06/26/20 was unremarkable  3. Low-lying placenta in second trimester resolved  4. Elevated blood pressure reading Asymptomatic Blood obtained today, will return tomorrow to leave urine sample as she used bathroom just before the visit Discussed possible diagnosis of gHTN and implication for IOL  Term labor symptoms and general obstetric precautions including but not limited to vaginal bleeding, contractions, leaking of fluid and fetal movement were reviewed in detail with the patient. Please refer to After Visit Summary for other counseling recommendations.  Return in 1 week (on 07/09/2020).   Venora Maples, MD

## 2020-07-02 NOTE — Patient Instructions (Signed)
 Contraception Choices Contraception, also called birth control, refers to methods or devices that prevent pregnancy. Hormonal methods Contraceptive implant A contraceptive implant is a thin, plastic tube that contains a hormone that prevents pregnancy. It is different from an intrauterine device (IUD). It is inserted into the upper part of the arm by a health care provider. Implants can be effective for up to 3 years. Progestin-only injections Progestin-only injections are injections of progestin, a synthetic form of the hormone progesterone. They are given every 3 months by a health care provider. Birth control pills Birth control pills are pills that contain hormones that prevent pregnancy. They must be taken once a day, preferably at the same time each day. A prescription is needed to use this method of contraception. Birth control patch The birth control patch contains hormones that prevent pregnancy. It is placed on the skin and must be changed once a week for three weeks and removed on the fourth week. A prescription is needed to use this method of contraception. Vaginal ring A vaginal ring contains hormones that prevent pregnancy. It is placed in the vagina for three weeks and removed on the fourth week. After that, the process is repeated with a new ring. A prescription is needed to use this method of contraception. Emergency contraceptive Emergency contraceptives prevent pregnancy after unprotected sex. They come in pill form and can be taken up to 5 days after sex. They work best the sooner they are taken after having sex. Most emergency contraceptives are available without a prescription. This method should not be used as your only form of birth control.   Barrier methods Female condom A female condom is a thin sheath that is worn over the penis during sex. Condoms keep sperm from going inside a woman's body. They can be used with a sperm-killing substance (spermicide) to increase their  effectiveness. They should be thrown away after one use. Female condom A female condom is a soft, loose-fitting sheath that is put into the vagina before sex. The condom keeps sperm from going inside a woman's body. They should be thrown away after one use. Diaphragm A diaphragm is a soft, dome-shaped barrier. It is inserted into the vagina before sex, along with a spermicide. The diaphragm blocks sperm from entering the uterus, and the spermicide kills sperm. A diaphragm should be left in the vagina for 6-8 hours after sex and removed within 24 hours. A diaphragm is prescribed and fitted by a health care provider. A diaphragm should be replaced every 1-2 years, after giving birth, after gaining more than 15 lb (6.8 kg), and after pelvic surgery. Cervical cap A cervical cap is a round, soft latex or plastic cup that fits over the cervix. It is inserted into the vagina before sex, along with spermicide. It blocks sperm from entering the uterus. The cap should be left in place for 6-8 hours after sex and removed within 48 hours. A cervical cap must be prescribed and fitted by a health care provider. It should be replaced every 2 years. Sponge A sponge is a soft, circular piece of polyurethane foam with spermicide in it. The sponge helps block sperm from entering the uterus, and the spermicide kills sperm. To use it, you make it wet and then insert it into the vagina. It should be inserted before sex, left in for at least 6 hours after sex, and removed and thrown away within 30 hours. Spermicides Spermicides are chemicals that kill or block sperm from entering the   cervix and uterus. They can come as a cream, jelly, suppository, foam, or tablet. A spermicide should be inserted into the vagina with an applicator at least 10-15 minutes before sex to allow time for it to work. The process must be repeated every time you have sex. Spermicides do not require a prescription.   Intrauterine  contraception Intrauterine device (IUD) An IUD is a T-shaped device that is put in a woman's uterus. There are two types:  Hormone IUD.This type contains progestin, a synthetic form of the hormone progesterone. This type can stay in place for 3-5 years.  Copper IUD.This type is wrapped in copper wire. It can stay in place for 10 years. Permanent methods of contraception Female tubal ligation In this method, a woman's fallopian tubes are sealed, tied, or blocked during surgery to prevent eggs from traveling to the uterus. Hysteroscopic sterilization In this method, a small, flexible insert is placed into each fallopian tube. The inserts cause scar tissue to form in the fallopian tubes and block them, so sperm cannot reach an egg. The procedure takes about 3 months to be effective. Another form of birth control must be used during those 3 months. Female sterilization This is a procedure to tie off the tubes that carry sperm (vasectomy). After the procedure, the man can still ejaculate fluid (semen). Another form of birth control must be used for 3 months after the procedure. Natural planning methods Natural family planning In this method, a couple does not have sex on days when the woman could become pregnant. Calendar method In this method, the woman keeps track of the length of each menstrual cycle, identifies the days when pregnancy can happen, and does not have sex on those days. Ovulation method In this method, a couple avoids sex during ovulation. Symptothermal method This method involves not having sex during ovulation. The woman typically checks for ovulation by watching changes in her temperature and in the consistency of cervical mucus. Post-ovulation method In this method, a couple waits to have sex until after ovulation. Where to find more information  Centers for Disease Control and Prevention: www.cdc.gov Summary  Contraception, also called birth control, refers to methods or  devices that prevent pregnancy.  Hormonal methods of contraception include implants, injections, pills, patches, vaginal rings, and emergency contraceptives.  Barrier methods of contraception can include female condoms, female condoms, diaphragms, cervical caps, sponges, and spermicides.  There are two types of IUDs (intrauterine devices). An IUD can be put in a woman's uterus to prevent pregnancy for 3-5 years.  Permanent sterilization can be done through a procedure for males and females. Natural family planning methods involve nothaving sex on days when the woman could become pregnant. This information is not intended to replace advice given to you by your health care provider. Make sure you discuss any questions you have with your health care provider. Document Revised: 10/02/2019 Document Reviewed: 10/02/2019 Elsevier Patient Education  2021 [employer] Inc.   Breastfeeding  Choosing to breastfeed is one of the best decisions you can make for yourself and your baby. A change in hormones during pregnancy causes your breasts to make breast milk in your milk-producing glands. Hormones prevent breast milk from being released before your baby is born. They also prompt milk flow after birth. Once breastfeeding has begun, thoughts of your baby, as well as his or her sucking or crying, can stimulate the release of milk from your milk-producing glands. Benefits of breastfeeding Research shows that breastfeeding offers many health benefits   for infants and mothers. It also offers a cost-free and convenient way to feed your baby. For your baby  Your first milk (colostrum) helps your baby's digestive system to function better.  Special cells in your milk (antibodies) help your baby to fight off infections.  Breastfed babies are less likely to develop asthma, allergies, obesity, or type 2 diabetes. They are also at lower risk for sudden infant death syndrome (SIDS).  Nutrients in breast milk are better  able to meet your baby's needs compared to infant formula.  Breast milk improves your baby's brain development. For you  Breastfeeding helps to create a very special bond between you and your baby.  Breastfeeding is convenient. Breast milk costs nothing and is always available at the correct temperature.  Breastfeeding helps to burn calories. It helps you to lose the weight that you gained during pregnancy.  Breastfeeding makes your uterus return faster to its size before pregnancy. It also slows bleeding (lochia) after you give birth.  Breastfeeding helps to lower your risk of developing type 2 diabetes, osteoporosis, rheumatoid arthritis, cardiovascular disease, and breast, ovarian, uterine, and endometrial cancer later in life. Breastfeeding basics Starting breastfeeding  Find a comfortable place to sit or lie down, with your neck and back well-supported.  Place a pillow or a rolled-up blanket under your baby to bring him or her to the level of your breast (if you are seated). Nursing pillows are specially designed to help support your arms and your baby while you breastfeed.  Make sure that your baby's tummy (abdomen) is facing your abdomen.  Gently massage your breast. With your fingertips, massage from the outer edges of your breast inward toward the nipple. This encourages milk flow. If your milk flows slowly, you may need to continue this action during the feeding.  Support your breast with 4 fingers underneath and your thumb above your nipple (make the letter "C" with your hand). Make sure your fingers are well away from your nipple and your baby's mouth.  Stroke your baby's lips gently with your finger or nipple.  When your baby's mouth is open wide enough, quickly bring your baby to your breast, placing your entire nipple and as much of the areola as possible into your baby's mouth. The areola is the colored area around your nipple. ? More areola should be visible above your  baby's upper lip than below the lower lip. ? Your baby's lips should be opened and extended outward (flanged) to ensure an adequate, comfortable latch. ? Your baby's tongue should be between his or her lower gum and your breast.  Make sure that your baby's mouth is correctly positioned around your nipple (latched). Your baby's lips should create a seal on your breast and be turned out (everted).  It is common for your baby to suck about 2-3 minutes in order to start the flow of breast milk. Latching Teaching your baby how to latch onto your breast properly is very important. An improper latch can cause nipple pain, decreased milk supply, and poor weight gain in your baby. Also, if your baby is not latched onto your nipple properly, he or she may swallow some air during feeding. This can make your baby fussy. Burping your baby when you switch breasts during the feeding can help to get rid of the air. However, teaching your baby to latch on properly is still the best way to prevent fussiness from swallowing air while breastfeeding. Signs that your baby has successfully latched onto   your nipple  Silent tugging or silent sucking, without causing you pain. Infant's lips should be extended outward (flanged).  Swallowing heard between every 3-4 sucks once your milk has started to flow (after your let-down milk reflex occurs).  Muscle movement above and in front of his or her ears while sucking. Signs that your baby has not successfully latched onto your nipple  Sucking sounds or smacking sounds from your baby while breastfeeding.  Nipple pain. If you think your baby has not latched on correctly, slip your finger into the corner of your baby's mouth to break the suction and place it between your baby's gums. Attempt to start breastfeeding again. Signs of successful breastfeeding Signs from your baby  Your baby will gradually decrease the number of sucks or will completely stop sucking.  Your baby  will fall asleep.  Your baby's body will relax.  Your baby will retain a small amount of milk in his or her mouth.  Your baby will let go of your breast by himself or herself. Signs from you  Breasts that have increased in firmness, weight, and size 1-3 hours after feeding.  Breasts that are softer immediately after breastfeeding.  Increased milk volume, as well as a change in milk consistency and color by the fifth day of breastfeeding.  Nipples that are not sore, cracked, or bleeding. Signs that your baby is getting enough milk  Wetting at least 1-2 diapers during the first 24 hours after birth.  Wetting at least 5-6 diapers every 24 hours for the first week after birth. The urine should be clear or pale yellow by the age of 5 days.  Wetting 6-8 diapers every 24 hours as your baby continues to grow and develop.  At least 3 stools in a 24-hour period by the age of 5 days. The stool should be soft and yellow.  At least 3 stools in a 24-hour period by the age of 7 days. The stool should be seedy and yellow.  No loss of weight greater than 10% of birth weight during the first 3 days of life.  Average weight gain of 4-7 oz (113-198 g) per week after the age of 4 days.  Consistent daily weight gain by the age of 5 days, without weight loss after the age of 2 weeks. After a feeding, your baby may spit up a small amount of milk. This is normal. Breastfeeding frequency and duration Frequent feeding will help you make more milk and can prevent sore nipples and extremely full breasts (breast engorgement). Breastfeed when you feel the need to reduce the fullness of your breasts or when your baby shows signs of hunger. This is called "breastfeeding on demand." Signs that your baby is hungry include:  Increased alertness, activity, or restlessness.  Movement of the head from side to side.  Opening of the mouth when the corner of the mouth or cheek is stroked (rooting).  Increased  sucking sounds, smacking lips, cooing, sighing, or squeaking.  Hand-to-mouth movements and sucking on fingers or hands.  Fussing or crying. Avoid introducing a pacifier to your baby in the first 4-6 weeks after your baby is born. After this time, you may choose to use a pacifier. Research has shown that pacifier use during the first year of a baby's life decreases the risk of sudden infant death syndrome (SIDS). Allow your baby to feed on each breast as long as he or she wants. When your baby unlatches or falls asleep while feeding from the   first breast, offer the second breast. Because newborns are often sleepy in the first few weeks of life, you may need to awaken your baby to get him or her to feed. Breastfeeding times will vary from baby to baby. However, the following rules can serve as a guide to help you make sure that your baby is properly fed:  Newborns (babies 4 weeks of age or younger) may breastfeed every 1-3 hours.  Newborns should not go without breastfeeding for longer than 3 hours during the day or 5 hours during the night.  You should breastfeed your baby a minimum of 8 times in a 24-hour period. Breast milk pumping Pumping and storing breast milk allows you to make sure that your baby is exclusively fed your breast milk, even at times when you are unable to breastfeed. This is especially important if you go back to work while you are still breastfeeding, or if you are not able to be present during feedings. Your lactation consultant can help you find a method of pumping that works best for you and give you guidelines about how long it is safe to store breast milk.      Caring for your breasts while you breastfeed Nipples can become dry, cracked, and sore while breastfeeding. The following recommendations can help keep your breasts moisturized and healthy:  Avoid using soap on your nipples.  Wear a supportive bra designed especially for nursing. Avoid wearing underwire-style  bras or extremely tight bras (sports bras).  Air-dry your nipples for 3-4 minutes after each feeding.  Use only cotton bra pads to absorb leaked breast milk. Leaking of breast milk between feedings is normal.  Use lanolin on your nipples after breastfeeding. Lanolin helps to maintain your skin's normal moisture barrier. Pure lanolin is not harmful (not toxic) to your baby. You may also hand express a few drops of breast milk and gently massage that milk into your nipples and allow the milk to air-dry. In the first few weeks after giving birth, some women experience breast engorgement. Engorgement can make your breasts feel heavy, warm, and tender to the touch. Engorgement peaks within 3-5 days after you give birth. The following recommendations can help to ease engorgement:  Completely empty your breasts while breastfeeding or pumping. You may want to start by applying warm, moist heat (in the shower or with warm, water-soaked hand towels) just before feeding or pumping. This increases circulation and helps the milk flow. If your baby does not completely empty your breasts while breastfeeding, pump any extra milk after he or she is finished.  Apply ice packs to your breasts immediately after breastfeeding or pumping, unless this is too uncomfortable for you. To do this: ? Put ice in a plastic bag. ? Place a towel between your skin and the bag. ? Leave the ice on for 20 minutes, 2-3 times a day.  Make sure that your baby is latched on and positioned properly while breastfeeding. If engorgement persists after 48 hours of following these recommendations, contact your health care provider or a lactation consultant. Overall health care recommendations while breastfeeding  Eat 3 healthy meals and 3 snacks every day. Well-nourished mothers who are breastfeeding need an additional 450-500 calories a day. You can meet this requirement by increasing the amount of a balanced diet that you eat.  Drink  enough water to keep your urine pale yellow or clear.  Rest often, relax, and continue to take your prenatal vitamins to prevent fatigue, stress, and low   vitamin and mineral levels in your body (nutrient deficiencies).  Do not use any products that contain nicotine or tobacco, such as cigarettes and e-cigarettes. Your baby may be harmed by chemicals from cigarettes that pass into breast milk and exposure to secondhand smoke. If you need help quitting, ask your health care provider.  Avoid alcohol.  Do not use illegal drugs or marijuana.  Talk with your health care provider before taking any medicines. These include over-the-counter and prescription medicines as well as vitamins and herbal supplements. Some medicines that may be harmful to your baby can pass through breast milk.  It is possible to become pregnant while breastfeeding. If birth control is desired, ask your health care provider about options that will be safe while breastfeeding your baby. Where to find more information: [employer]: www.llli.org Contact a health care provider if:  You feel like you want to stop breastfeeding or have become frustrated with breastfeeding.  Your nipples are cracked or bleeding.  Your breasts are red, tender, or warm.  You have: ? Painful breasts or nipples. ? A swollen area on either breast. ? A fever or chills. ? Nausea or vomiting. ? Drainage other than breast milk from your nipples.  Your breasts do not become full before feedings by the fifth day after you give birth.  You feel sad and depressed.  Your baby is: ? Too sleepy to eat well. ? Having trouble sleeping. ? More than 1 week old and wetting fewer than 6 diapers in a 24-hour period. ? Not gaining weight by 5 days of age.  Your baby has fewer than 3 stools in a 24-hour period.  Your baby's skin or the white parts of his or her eyes become yellow. Get help right away if:  Your baby is overly tired  (lethargic) and does not want to wake up and feed.  Your baby develops an unexplained fever. Summary  Breastfeeding offers many health benefits for infant and mothers.  Try to breastfeed your infant when he or she shows early signs of hunger.  Gently tickle or stroke your baby's lips with your finger or nipple to allow the baby to open his or her mouth. Bring the baby to your breast. Make sure that much of the areola is in your baby's mouth. Offer one side and burp the baby before you offer the other side.  Talk with your health care provider or lactation consultant if you have questions or you face problems as you breastfeed. This information is not intended to replace advice given to you by your health care provider. Make sure you discuss any questions you have with your health care provider. Document Revised: 07/22/2017 Document Reviewed: 05/29/2016 Elsevier Patient Education  2021 Elsevier Inc.  

## 2020-07-04 LAB — GC/CHLAMYDIA PROBE AMP (~~LOC~~) NOT AT ARMC
Chlamydia: NEGATIVE
Comment: NEGATIVE
Comment: NORMAL
Neisseria Gonorrhea: NEGATIVE

## 2020-07-04 LAB — PROTEIN / CREATININE RATIO, URINE

## 2020-07-06 LAB — CULTURE, BETA STREP (GROUP B ONLY): Strep Gp B Culture: POSITIVE — AB

## 2020-07-07 ENCOUNTER — Encounter: Payer: Self-pay | Admitting: Family Medicine

## 2020-07-07 DIAGNOSIS — O99820 Streptococcus B carrier state complicating pregnancy: Secondary | ICD-10-CM | POA: Insufficient documentation

## 2020-07-08 LAB — COMPREHENSIVE METABOLIC PANEL
ALT: 6 IU/L (ref 0–32)
AST: 19 IU/L (ref 0–40)
Albumin/Globulin Ratio: 1.1 — ABNORMAL LOW (ref 1.2–2.2)
Albumin: 3.1 g/dL — ABNORMAL LOW (ref 3.9–5.0)
Alkaline Phosphatase: 146 IU/L — ABNORMAL HIGH (ref 44–121)
BUN/Creatinine Ratio: 4 — ABNORMAL LOW (ref 9–23)
BUN: 3 mg/dL — ABNORMAL LOW (ref 6–20)
Bilirubin Total: <0.2 mg/dL (ref 0.0–1.2)
CO2: 13 mmol/L — ABNORMAL LOW (ref 20–29)
Calcium: 8.6 mg/dL — ABNORMAL LOW (ref 8.7–10.2)
Chloride: 103 mmol/L (ref 96–106)
Creatinine, Ser: 0.73 mg/dL (ref 0.57–1.00)
GFR calc Af Amer: 131 mL/min/{1.73_m2} (ref >59)
GFR calc non Af Amer: 113 mL/min/{1.73_m2} (ref >59)
Globulin, Total: 2.9 g/dL (ref 1.5–4.5)
Glucose: 83 mg/dL (ref 65–99)
Potassium: 4.4 mmol/L (ref 3.5–5.2)
Sodium: 136 mmol/L (ref 134–144)
Total Protein: 6 g/dL (ref 6.0–8.5)

## 2020-07-08 LAB — CBC
Hematocrit: 32.1 % — ABNORMAL LOW (ref 34.0–46.6)
Hemoglobin: 10.3 g/dL — ABNORMAL LOW (ref 11.1–15.9)
MCH: 25 pg — ABNORMAL LOW (ref 26.6–33.0)
MCHC: 32.1 g/dL (ref 31.5–35.7)
MCV: 78 fL — ABNORMAL LOW (ref 79–97)
Platelets: 251 10*3/uL (ref 150–450)
RBC: 4.12 x10E6/uL (ref 3.77–5.28)
RDW: 13.5 % (ref 11.7–15.4)
WBC: 10.3 10*3/uL (ref 3.4–10.8)

## 2020-07-11 ENCOUNTER — Ambulatory Visit (INDEPENDENT_AMBULATORY_CARE_PROVIDER_SITE_OTHER): Payer: Medicaid Other | Admitting: Obstetrics and Gynecology

## 2020-07-11 ENCOUNTER — Encounter: Payer: Self-pay | Admitting: *Deleted

## 2020-07-11 ENCOUNTER — Other Ambulatory Visit: Payer: Self-pay

## 2020-07-11 VITALS — BP 119/78 | HR 90 | Wt 128.8 lb

## 2020-07-11 DIAGNOSIS — O99820 Streptococcus B carrier state complicating pregnancy: Secondary | ICD-10-CM

## 2020-07-11 DIAGNOSIS — Z3A38 38 weeks gestation of pregnancy: Secondary | ICD-10-CM | POA: Insufficient documentation

## 2020-07-11 DIAGNOSIS — O24419 Gestational diabetes mellitus in pregnancy, unspecified control: Secondary | ICD-10-CM

## 2020-07-11 DIAGNOSIS — Z3480 Encounter for supervision of other normal pregnancy, unspecified trimester: Secondary | ICD-10-CM

## 2020-07-11 DIAGNOSIS — R87612 Low grade squamous intraepithelial lesion on cytologic smear of cervix (LGSIL): Secondary | ICD-10-CM

## 2020-07-11 MED ORDER — GLUCOSE BLOOD VI STRP: ORAL_STRIP | 12 refills | Status: DC

## 2020-07-11 NOTE — Progress Notes (Signed)
Addendum: IOL scheduled for 07/23/20 0000. Called patient to notify but heard message " voicemail full". Will send Mychart message. Lalonnie Shaffer,RN

## 2020-07-11 NOTE — Patient Instructions (Signed)

## 2020-07-11 NOTE — Progress Notes (Signed)
States is doing cbg's , has them on phone and will send via MyChart to Dr. Donavan Foil now. Slater Mcmanaman,RN

## 2020-07-11 NOTE — Progress Notes (Signed)
   PRENATAL VISIT NOTE  Subjective:  Candace Lowe is a 30 y.o. G2P0010 at [redacted]w[redacted]d being seen today for ongoing prenatal care.  She is currently monitored for the following issues for this high-risk pregnancy and has Supervision of other normal pregnancy, antepartum; Low-lying placenta in second trimester; Gestational diabetes; Low grade squamous intraepithelial lesion on cytologic smear of cervix (LGSIL); GBS (group B Streptococcus carrier), +RV culture, currently pregnant; and [redacted] weeks gestation of pregnancy on their problem list.  Patient doing well with no acute concerns today. She reports no complaints.  Contractions: Not present. Vag. Bleeding: None.  Movement: Present. Denies leaking of fluid.   FBS:  74-83 PPBS: 75-105 Blood sugars in excellent control.  Pt has completed waterbirth class and has received her certificate.  She apparently has not signed the waterbirth consent.  Pt informed if she has to be induced that she may not receive a waterbirth.  The following portions of the patient's history were reviewed and updated as appropriate: allergies, current medications, past family history, past medical history, past social history, past surgical history and problem list. Problem list updated.  Objective:   Vitals:   07/11/20 1357 07/11/20 1405  BP: (!) 136/95 119/78  Pulse: 69 90  Weight: 128 lb 12.8 oz (58.4 kg)     Fetal Status: Fetal Heart Rate (bpm): 144 Fundal Height: 38 cm Movement: Present     General:  Alert, oriented and cooperative. Patient is in no acute distress.  Skin: Skin is warm and dry. No rash noted.   Cardiovascular: Normal heart rate noted  Respiratory: Normal respiratory effort, no problems with respiration noted  Abdomen: Soft, gravid, appropriate for gestational age.  Pain/Pressure: Present     Pelvic: Cervical exam deferred        Extremities: Normal range of motion.  Edema: None  Mental Status:  Normal mood and affect. Normal behavior. Normal  judgment and thought content.   Assessment and Plan:  Pregnancy: G2P0010 at [redacted]w[redacted]d  1. Gestational diabetes mellitus (GDM), antepartum, gestational diabetes method of control unspecified Good control noted, will schedule IOL at 4o weeks  2. Supervision of other normal pregnancy, antepartum   3. Low grade squamous intraepithelial lesion on cytologic smear of cervix (LGSIL)   4. GBS (group B Streptococcus carrier), +RV culture, currently pregnant Treat in  labor  5. [redacted] weeks gestation of pregnancy   Term labor symptoms and general obstetric precautions including but not limited to vaginal bleeding, contractions, leaking of fluid and fetal movement were reviewed in detail with the patient.  Please refer to After Visit Summary for other counseling recommendations.   Return in about 1 week (around 07/18/2020) for [facility], in person.   Mariel Aloe, MD Faculty Attending [facility]

## 2020-07-12 ENCOUNTER — Telehealth (HOSPITAL_COMMUNITY): Payer: Self-pay | Admitting: *Deleted

## 2020-07-12 ENCOUNTER — Encounter (HOSPITAL_COMMUNITY): Payer: Self-pay | Admitting: *Deleted

## 2020-07-12 LAB — POCT URINALYSIS DIP (DEVICE)
Bilirubin Urine: NEGATIVE
Glucose, UA: NEGATIVE mg/dL
Hgb urine dipstick: NEGATIVE
Ketones, ur: NEGATIVE mg/dL
Leukocytes,Ua: NEGATIVE
Nitrite: NEGATIVE
Protein, ur: NEGATIVE mg/dL
Specific Gravity, Urine: 1.015 (ref 1.005–1.030)
Urobilinogen, UA: 0.2 mg/dL (ref 0.0–1.0)
pH: 7 (ref 5.0–8.0)

## 2020-07-12 NOTE — Telephone Encounter (Signed)
Preadmission screen  

## 2020-07-14 ENCOUNTER — Inpatient Hospital Stay (HOSPITAL_COMMUNITY)
Admission: AD | Admit: 2020-07-14 | Discharge: 2020-07-16 | DRG: 807 | Disposition: A | Discharge status: Home / Self Care | Payer: Medicaid Other | Attending: Obstetrics and Gynecology | Admitting: Obstetrics and Gynecology

## 2020-07-14 ENCOUNTER — Other Ambulatory Visit: Payer: Self-pay

## 2020-07-14 ENCOUNTER — Encounter (HOSPITAL_COMMUNITY): Payer: Self-pay | Admitting: Obstetrics and Gynecology

## 2020-07-14 DIAGNOSIS — O1404 Mild to moderate pre-eclampsia, complicating childbirth: Secondary | ICD-10-CM | POA: Diagnosis present

## 2020-07-14 DIAGNOSIS — Z23 Encounter for immunization: Secondary | ICD-10-CM

## 2020-07-14 DIAGNOSIS — O24410 Gestational diabetes mellitus in pregnancy, diet controlled: Secondary | ICD-10-CM

## 2020-07-14 DIAGNOSIS — O24419 Gestational diabetes mellitus in pregnancy, unspecified control: Secondary | ICD-10-CM | POA: Diagnosis present

## 2020-07-14 DIAGNOSIS — O9902 Anemia complicating childbirth: Secondary | ICD-10-CM | POA: Diagnosis present

## 2020-07-14 DIAGNOSIS — Z3A38 38 weeks gestation of pregnancy: Secondary | ICD-10-CM

## 2020-07-14 DIAGNOSIS — O24420 Gestational diabetes mellitus in childbirth, diet controlled: Secondary | ICD-10-CM | POA: Diagnosis present

## 2020-07-14 DIAGNOSIS — D649 Anemia, unspecified: Secondary | ICD-10-CM | POA: Diagnosis present

## 2020-07-14 DIAGNOSIS — O26893 Other specified pregnancy related conditions, third trimester: Secondary | ICD-10-CM | POA: Diagnosis present

## 2020-07-14 DIAGNOSIS — O4442 Low lying placenta NOS or without hemorrhage, second trimester: Secondary | ICD-10-CM

## 2020-07-14 DIAGNOSIS — O4290 Premature rupture of membranes, unspecified as to length of time between rupture and onset of labor, unspecified weeks of gestation: Secondary | ICD-10-CM

## 2020-07-14 DIAGNOSIS — Z3480 Encounter for supervision of other normal pregnancy, unspecified trimester: Secondary | ICD-10-CM

## 2020-07-14 DIAGNOSIS — Z20822 Contact with and (suspected) exposure to covid-19: Secondary | ICD-10-CM | POA: Diagnosis present

## 2020-07-14 DIAGNOSIS — O99824 Streptococcus B carrier state complicating childbirth: Secondary | ICD-10-CM | POA: Diagnosis present

## 2020-07-14 DIAGNOSIS — O1403 Mild to moderate pre-eclampsia, third trimester: Secondary | ICD-10-CM | POA: Diagnosis present

## 2020-07-14 DIAGNOSIS — O99820 Streptococcus B carrier state complicating pregnancy: Secondary | ICD-10-CM

## 2020-07-14 DIAGNOSIS — O326XX0 Maternal care for compound presentation, not applicable or unspecified: Secondary | ICD-10-CM | POA: Diagnosis not present

## 2020-07-14 LAB — RESP PANEL BY RT-PCR (FLU A&B, COVID) ARPGX2
Influenza A by PCR: NEGATIVE
Influenza B by PCR: NEGATIVE
SARS Coronavirus 2 by RT PCR: NEGATIVE

## 2020-07-14 LAB — PROTEIN / CREATININE RATIO, URINE
Creatinine, Urine: 35.77 mg/dL
Creatinine, Urine: 106.31 mg/dL
Protein Creatinine Ratio: 1.65 mg/mg{Cre} — ABNORMAL HIGH (ref 0.00–0.15)
Protein Creatinine Ratio: 1.13 mg/mg{Cre} — ABNORMAL HIGH (ref 0.00–0.15)
Total Protein, Urine: 59 mg/dL
Total Protein, Urine: 120 mg/dL

## 2020-07-14 LAB — TYPE AND SCREEN
ABO/RH(D): O POS
Antibody Screen: NEGATIVE

## 2020-07-14 LAB — CBC
HCT: 37.3 % (ref 36.0–46.0)
Hemoglobin: 11.8 g/dL — ABNORMAL LOW (ref 12.0–15.0)
MCH: 25.4 pg — ABNORMAL LOW (ref 26.0–34.0)
MCHC: 31.6 g/dL (ref 30.0–36.0)
MCV: 80.2 fL (ref 80.0–100.0)
Platelets: 259 10*3/uL (ref 150–400)
RBC: 4.65 MIL/uL (ref 3.87–5.11)
RDW: 14.2 % (ref 11.5–15.5)
WBC: 9.7 10*3/uL (ref 4.0–10.5)
nRBC: 0 % (ref 0.0–0.2)

## 2020-07-14 LAB — COMPREHENSIVE METABOLIC PANEL
ALT: 10 U/L (ref 0–44)
AST: 21 U/L (ref 15–41)
Albumin: 2.5 g/dL — ABNORMAL LOW (ref 3.5–5.0)
Alkaline Phosphatase: 160 U/L — ABNORMAL HIGH (ref 38–126)
Anion gap: 9 (ref 5–15)
BUN: 7 mg/dL (ref 6–20)
CO2: 18 mmol/L — ABNORMAL LOW (ref 22–32)
Calcium: 8.8 mg/dL — ABNORMAL LOW (ref 8.9–10.3)
Chloride: 106 mmol/L (ref 98–111)
Creatinine, Ser: 0.78 mg/dL (ref 0.44–1.00)
GFR, Estimated: >60 mL/min (ref >60)
Glucose, Bld: 91 mg/dL (ref 70–99)
Potassium: 4.2 mmol/L (ref 3.5–5.1)
Sodium: 133 mmol/L — ABNORMAL LOW (ref 135–145)
Total Bilirubin: 0.6 mg/dL (ref 0.3–1.2)
Total Protein: 6.5 g/dL (ref 6.5–8.1)

## 2020-07-14 LAB — GLUCOSE, CAPILLARY
Glucose-Capillary: 67 mg/dL — ABNORMAL LOW (ref 70–99)
Glucose-Capillary: 158 mg/dL — ABNORMAL HIGH (ref 70–99)

## 2020-07-14 LAB — POCT FERN TEST: POCT Fern Test: POSITIVE

## 2020-07-14 MED ORDER — ONDANSETRON HCL 4 MG/2ML IJ SOLN: 4.0000 mg | Freq: Four times a day (QID) | INTRAMUSCULAR | Status: DC | PRN

## 2020-07-14 MED ORDER — DIPHENHYDRAMINE HCL 25 MG PO CAPS: 25.0000 mg | ORAL_CAPSULE | Freq: Four times a day (QID) | ORAL | Status: DC | PRN

## 2020-07-14 MED ORDER — ACETAMINOPHEN 325 MG PO TABS: 650.0000 mg | ORAL_TABLET | ORAL | Status: DC | PRN

## 2020-07-14 MED ORDER — MAGNESIUM HYDROXIDE 400 MG/5ML PO SUSP: 30.0000 mL | ORAL | Status: DC | PRN

## 2020-07-14 MED ORDER — ACETAMINOPHEN 325 MG PO TABS
650.0000 mg | ORAL_TABLET | ORAL | Status: DC | PRN
  Administered 2020-07-15 – 2020-07-16 (×3): 650 mg via ORAL
  Filled 2020-07-14 (×3): qty 2

## 2020-07-14 MED ORDER — WITCH HAZEL-GLYCERIN EX PADS
1.0000 "application " | MEDICATED_PAD | CUTANEOUS | Status: DC | PRN
  Administered 2020-07-15: 1 via TOPICAL

## 2020-07-14 MED ORDER — SIMETHICONE 80 MG PO CHEW: 80.0000 mg | CHEWABLE_TABLET | ORAL | Status: DC | PRN

## 2020-07-14 MED ORDER — LACTATED RINGERS IV SOLN
500.0000 mL | INTRAVENOUS | Status: DC | PRN
  Administered 2020-07-14: 500 mL via INTRAVENOUS

## 2020-07-14 MED ORDER — OXYCODONE-ACETAMINOPHEN 5-325 MG PO TABS: 2.0000 | ORAL_TABLET | ORAL | Status: DC | PRN

## 2020-07-14 MED ORDER — BENZOCAINE-MENTHOL 20-0.5 % EX AERO
1.0000 "application " | INHALATION_SPRAY | CUTANEOUS | Status: DC | PRN
  Administered 2020-07-15: 1 via TOPICAL
  Filled 2020-07-14 (×2): qty 56

## 2020-07-14 MED ORDER — IBUPROFEN 600 MG PO TABS
600.0000 mg | ORAL_TABLET | Freq: Four times a day (QID) | ORAL | Status: DC
  Administered 2020-07-15 – 2020-07-16 (×6): 600 mg via ORAL
  Filled 2020-07-14 (×6): qty 1

## 2020-07-14 MED ORDER — COCONUT OIL OIL: 1.0000 "application " | TOPICAL_OIL | Status: DC | PRN

## 2020-07-14 MED ORDER — OXYTOCIN-SODIUM CHLORIDE 30-0.9 UT/500ML-% IV SOLN
2.5000 [IU]/h | INTRAVENOUS | Status: DC
Start: 2020-07-14 — End: 2020-07-14
  Administered 2020-07-14: 2.5 [IU]/h via INTRAVENOUS
  Filled 2020-07-14: qty 500

## 2020-07-14 MED ORDER — ONDANSETRON HCL 4 MG/2ML IJ SOLN: 4.0000 mg | INTRAMUSCULAR | Status: DC | PRN

## 2020-07-14 MED ORDER — DIBUCAINE (PERIANAL) 1 % EX OINT: 1.0000 "application " | TOPICAL_OINTMENT | CUTANEOUS | Status: DC | PRN

## 2020-07-14 MED ORDER — HYDROMORPHONE HCL 1 MG/ML IJ SOLN
1.0000 mg | Freq: Once | INTRAMUSCULAR | Status: AC
Start: 2020-07-14 — End: 2020-07-14
  Administered 2020-07-14: 0.5 mg via INTRAVENOUS
  Filled 2020-07-14: qty 1

## 2020-07-14 MED ORDER — PRENATAL MULTIVITAMIN CH
1.0000 | ORAL_TABLET | Freq: Every day | ORAL | Status: DC
Start: 2020-07-15 — End: 2020-07-16
  Administered 2020-07-15: 1 via ORAL
  Filled 2020-07-14: qty 1

## 2020-07-14 MED ORDER — LACTATED RINGERS IV SOLN
INTRAVENOUS | Status: DC
  Administered 2020-07-14: 125 mL/h via INTRAVENOUS

## 2020-07-14 MED ORDER — PENICILLIN G POT IN DEXTROSE 60000 UNIT/ML IV SOLN
3.0000 10*6.[IU] | INTRAVENOUS | Status: DC
  Administered 2020-07-14: 3 10*6.[IU] via INTRAVENOUS
  Filled 2020-07-14: qty 50

## 2020-07-14 MED ORDER — MEASLES, MUMPS & RUBELLA VAC IJ SOLR: 0.5000 mL | Freq: Once | INTRAMUSCULAR | Status: DC

## 2020-07-14 MED ORDER — OXYTOCIN BOLUS FROM INFUSION
333.0000 mL | Freq: Once | INTRAVENOUS | Status: AC
  Administered 2020-07-14: 333 mL via INTRAVENOUS

## 2020-07-14 MED ORDER — TETANUS-DIPHTH-ACELL PERTUSSIS 5-2.5-18.5 LF-MCG/0.5 IM SUSY
0.5000 mL | PREFILLED_SYRINGE | Freq: Once | INTRAMUSCULAR | Status: AC
  Administered 2020-07-16: 0.5 mL via INTRAMUSCULAR
  Filled 2020-07-14: qty 0.5

## 2020-07-14 MED ORDER — DOCUSATE SODIUM 100 MG PO CAPS
100.0000 mg | ORAL_CAPSULE | Freq: Two times a day (BID) | ORAL | Status: DC
  Administered 2020-07-15 – 2020-07-16 (×3): 100 mg via ORAL
  Filled 2020-07-14 (×3): qty 1

## 2020-07-14 MED ORDER — FENTANYL CITRATE (PF) 100 MCG/2ML IJ SOLN
50.0000 ug | INTRAMUSCULAR | Status: DC | PRN
  Administered 2020-07-14 (×3): 100 ug via INTRAVENOUS
  Filled 2020-07-14 (×3): qty 2

## 2020-07-14 MED ORDER — FLEET ENEMA 7-19 GM/118ML RE ENEM: 1.0000 | ENEMA | RECTAL | Status: DC | PRN

## 2020-07-14 MED ORDER — OXYCODONE-ACETAMINOPHEN 5-325 MG PO TABS: 1.0000 | ORAL_TABLET | ORAL | Status: DC | PRN

## 2020-07-14 MED ORDER — SODIUM CHLORIDE 0.9 % IV SOLN
5.0000 10*6.[IU] | Freq: Once | INTRAVENOUS | Status: AC
  Administered 2020-07-14: 5 10*6.[IU] via INTRAVENOUS
  Filled 2020-07-14: qty 5

## 2020-07-14 MED ORDER — ONDANSETRON HCL 4 MG PO TABS: 4.0000 mg | ORAL_TABLET | ORAL | Status: DC | PRN

## 2020-07-14 MED ORDER — LIDOCAINE HCL (PF) 1 % IJ SOLN
30.0000 mL | INTRAMUSCULAR | Status: AC | PRN
  Administered 2020-07-14: 30 mL via SUBCUTANEOUS
  Filled 2020-07-14: qty 30

## 2020-07-14 MED ORDER — SOD CITRATE-CITRIC ACID 500-334 MG/5ML PO SOLN: 30.0000 mL | ORAL | Status: DC | PRN

## 2020-07-14 MED ORDER — MEDROXYPROGESTERONE ACETATE 150 MG/ML IM SUSP: 150.0000 mg | INTRAMUSCULAR | Status: DC | PRN

## 2020-07-14 NOTE — Lactation Note (Signed)
This note was copied from a baby's chart. Lactation Consultation Note  Patient Name: Girl Colletta Spillers Today's Date: 07/14/2020   Age:28 hours  Attempted to visit with mother, but L&D RN Pietro Cassis let mom know that she was asleep; they had to give her pain medication, she experienced some complications with her blood pressure (she originally planned a water birth) but still had an unmedicated birth as she wished.   Spoke to FOB, he was doing STS when entered the room; let him know that lactation will be visiting later tonight or tomorrow morning at the [facility] once mom is awake and alert. FOB aware of LC services and will call PRN.   Maternal Data    Feeding    LATCH Score                    Lactation Tools Discussed/Used    Interventions    Discharge    Consult Status      Kynzlee Hucker Venetia Constable 07/14/2020, 8:35 PM

## 2020-07-14 NOTE — MAU Note (Signed)
Candace Lowe is a 30 y.o. at [redacted]w[redacted]d here in MAU reporting:  LOF for the past 45 minutes, it is clear. Having some contractions and a small amount of bleeding.  Onset of complaint: today  Pain score: 10/10  Vitals:   07/14/20 1138  BP: 127/88  Pulse: 73  Resp: 18  Temp: 98.5 F (36.9 C)  SpO2: 97%     FHT: EFM applied in room  Lab orders placed from triage: none

## 2020-07-14 NOTE — Progress Notes (Signed)
Candace Lowe is a 30 y.o. G2P0010 at [redacted]w[redacted]d.  Subjective: Much more uncomfortable. Feeling some rectal pressure.   Objective: BP (!) 151/88 (BP Location: Right Arm)   Pulse 71   Temp 98.8 F (37.1 C) (Oral)   Resp 20   Ht 5\' 4"  (1.626 m)   Wt 58.5 kg   LMP 10/17/2019 (Exact Date)   SpO2 97% Comment: room air  BMI 22.14 kg/m    FHT:  FHR: 145 bpm, variability: mod,  accelerations:  15x15,  decelerations:  Difficult to assess due to pt intolerance of monitor and requesting monitors not be adjusted.  UC:   Q 2-4 minutes, strong Dilation: 7 Effacement (%): 90 Cervical Position: Middle Station: Plus 1 Presentation: Vertex Exam by:: [email or web address], CNM  Labs: Results for orders placed or performed during the hospital encounter of 07/14/20 (from the past 24 hour(s))  POCT fern test     Status: None   Collection Time: 07/14/20 11:55 AM  Result Value Ref Range   POCT Fern Test Positive = ruptured amniotic membanes   Type and screen [facility]     Status: None   Collection Time: 07/14/20 12:20 PM  Result Value Ref Range   ABO/RH(D) O POS    Antibody Screen NEG    Sample Expiration      07/17/2020,2359 Performed at [facility], [address], [place], [place]   CBC     Status: Abnormal   Collection Time: 07/14/20 12:21 PM  Result Value Ref Range   WBC 9.7 4.0 - 10.5 K/uL   RBC 4.65 3.87 - 5.11 MIL/uL   Hemoglobin 11.8 (L) 12.0 - 15.0 g/dL   HCT 09/13/20 60.6 - 30.1 %   MCV 80.2 80.0 - 100.0 fL   MCH 25.4 (L) 26.0 - 34.0 pg   MCHC 31.6 30.0 - 36.0 g/dL   RDW 60.1 09.3 - 23.5 %   Platelets 259 150 - 400 K/uL   nRBC 0.0 0.0 - 0.2 %  Resp Panel by RT-PCR (Flu A&B, Covid) Nasopharyngeal Swab     Status: None   Collection Time: 07/14/20 12:21 PM   Specimen: Nasopharyngeal Swab; Nasopharyngeal(NP) swabs in vial transport medium  Result Value Ref Range   SARS Coronavirus 2 by RT PCR NEGATIVE NEGATIVE   Influenza A by PCR NEGATIVE NEGATIVE    Influenza B by PCR NEGATIVE NEGATIVE  Comprehensive metabolic panel     Status: Abnormal   Collection Time: 07/14/20 12:21 PM  Result Value Ref Range   Sodium 133 (L) 135 - 145 mmol/L   Potassium 4.2 3.5 - 5.1 mmol/L   Chloride 106 98 - 111 mmol/L   CO2 18 (L) 22 - 32 mmol/L   Glucose, Bld 91 70 - 99 mg/dL   BUN 7 6 - 20 mg/dL   Creatinine, Ser 09/13/20 0.44 - 1.00 mg/dL   Calcium 8.8 (L) 8.9 - 10.3 mg/dL   Total Protein 6.5 6.5 - 8.1 g/dL   Albumin 2.5 (L) 3.5 - 5.0 g/dL   AST 21 15 - 41 U/L   ALT 10 0 - 44 U/L   Alkaline Phosphatase 160 (H) 38 - 126 U/L   Total Bilirubin 0.6 0.3 - 1.2 mg/dL   GFR, Estimated 2.20 >25 mL/min   Anion gap 9 5 - 15  Glucose, capillary     Status: Abnormal   Collection Time: 07/14/20  2:25 PM  Result Value Ref Range   Glucose-Capillary 67 (L)  70 - 99 mg/dL  Protein / creatinine ratio, urine     Status: Abnormal   Collection Time: 07/14/20  4:14 PM  Result Value Ref Range   Creatinine, Urine 35.77 mg/dL   Total Protein, Urine 59 mg/dL   Protein Creatinine Ratio 1.65 (H) 0.00 - 0.15 mg/mg[Cre]    Assessment / Plan: [redacted]w[redacted]d week IUP Labor: Transition. Progressing well w/out augmentation.  Fetal Wellbeing:  Category I when tracing well. Explained again importance of tracing FHR accurately.  Pain Control:  Requesting IV pain meds Anticipated MOD:  SVD GHTN vs mild Pre-E: P:C elevated but repeated due to possible contamination w/ amniotic fluid and mucus. Pending.   Katrinka Blazing, [place], [place] 07/14/2020 6:53 PM

## 2020-07-14 NOTE — H&P (Cosign Needed Addendum)
Candace Lowe is a 30 y.o. female presenting for SROM and contractions since 11:00 today. Denies VB. + FM. Pregnancy complicated by well-controlled A1GDM. Planning Waterbirth.   [place] 06/23/20: Est. FW: 3008  gm 6 lb 10 oz 67  %. Ac 90%.   Nursing Staff Provider  Office Location [facility] Dating   LMP  Language  English Anatomy US   normal with low lying placenta, F/U->resolved   Flu Vaccine  Declined-02/02/20 Genetic Screen  NIPS: low risk female   AFP:   Screen negative  TDaP vaccine   declined Hgb A1C or  GTT Early  Third trimester failed  COVID vaccine No   LAB RESULTS   Rhogam  NA Blood Type O/Positive/-- (08/27 1155)   Feeding Plan Breast Antibody Negative (08/27 1155)  Contraception None Rubella 1.09 (08/27 1155) Immune  Circumcision NA (girl) RPR Non Reactive (08/27 1155)   Pediatrician  List given HBsAg Negative (08/27 1155)   Support Person FOB HCVAb Negative  Prenatal Classes Info given HIV Non Reactive (08/27 1155)     BTL Consent NA GBS  (For PCN allergy, check sensitivities) Positive   VBAC Consent NA Pap  will get records     Hgb Electro  Nml  BP Cuff Ordered 02/29/20 CF Nml    SMA Low risk   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 [redacted] weeks gestation of pregnancy 07/11/2020  . GBS (group B Streptococcus carrier), +RV culture, currently pregnant 07/07/2020  . Gestational diabetes 05/16/2020  . Low-lying placenta in second trimester--> resolved 03/01/2020  . Supervision of other normal pregnancy, antepartum 01/05/2020  . 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 Gestational diabetes   . Mental disorder   . Seasonal depression (HCC)    Past Surgical History:  Procedure Laterality Date  . WISDOM TOOTH EXTRACTION  2017   Family History: family history includes Diabetes in her father, paternal grandfather, and paternal grandmother. Social History:  reports that she has never smoked. She has never used smokeless tobacco. She reports previous drug use. Drug: Marijuana. She reports that she does not drink alcohol.  Review of Systems  Constitutional: Negative for chills and fever.  Eyes: Negative for visual disturbance.  Gastrointestinal: Positive for abdominal pain (contactions only). Negative for nausea and vomiting.  Genitourinary: Negative for vaginal bleeding.       LOF since 1100  Neurological: Negative for headaches.   Maternal Medical History:  Reason for admission: Rupture of membranes and contractions.  Nausea.  Contractions: Frequency: regular.   Perceived severity is moderate.    Fetal activity: Perceived fetal activity is normal.    Prenatal complications: PIH (HTN x 2 office visits. ).   No IUGR.   Prenatal Complications - Diabetes: gestational. Diabetes is managed by diet.  Dilation: 2.5 Effacement (%): 80 Station: -2 Exam by:: n druebbisch rn Blood pressure 127/88, pulse 73, temperature 98.5 F (36.9 C), temperature source Oral, resp. rate 18, weight 58.7 kg, last menstrual period 10/17/2019, SpO2 97 %. Maternal Exam:  Uterine Assessment: Contraction strength is moderate.  Contraction frequency is regular.   Abdomen: Patient reports no abdominal tenderness. Estimated fetal weight is 8 lb exprapolated from [place] 3 weeks ago.   Fetal presentation: vertex  Introitus: Normal vulva. Normal vagina.  Ferning test: positive.  Amniotic fluid character: clear.  Pelvis: adequate for delivery.   Cervix: Cervix evaluated by digital exam.     Fetal Exam Fetal Monitor Review: Baseline rate: 135.   Variability: moderate (6-25 bpm).   Pattern: accelerations present and no decelerations.    Fetal State Assessment: Category I - tracings are normal.     Physical Exam Vitals and nursing note reviewed. Exam conducted with a chaperone present.  Constitutional:      General: She is in acute distress.     Appearance: Normal appearance. She is not ill-appearing.  Eyes:     General: No scleral icterus. Cardiovascular:     Rate and Rhythm: Normal rate.  Pulmonary:     Effort: Pulmonary effort is normal. No respiratory distress.  Abdominal:     Tenderness: There is no abdominal tenderness.  Genitourinary:    General: Normal vulva.  Musculoskeletal:        General: Swelling present.  Skin:    General: Skin is warm and dry.  Neurological:     Mental Status: She is alert and oriented to person, place, and time.  Psychiatric:        Mood and Affect: Mood normal.     Prenatal labs: ABO, Rh: O/Positive/-- (08/27 1155) Antibody: Negative (08/27 1155) Rubella: 1.09 (08/27 1155) RPR: Non Reactive (01/05 0919)  HBsAg: Negative (08/27 1155)  HIV: Non Reactive (01/05 0919)  GBS: Positive/-- (02/22 1656)   Assessment: 1. Labor: Early/SROM 2. Fetal Wellbeing: Category II. Hasn't been on monitor long.  3. Pain Control: Planning Watebirth 4. GBS: Pos 5. 38.3 week IUP 6. Elevated BP in office, now elevated x 2. Meets criteria for GHTN at minimum  7. Well-controlled A1GDM. EFW 67%tile  Plan:  1. Admit to BS per consult with MD 2. Routine L&D orders. Will expectantly manage labor for now since pt is contracting regularly and painfully.  3. Analgesia/anesthesia PRN  4. PCN for GBS prophylaxis 5. Pre-E labs 6. Not a candidate for waterbirth. Pt and family disappointed. Lengthy conversation about why GHTN is a contraindication.  Dorathy Kinsman 07/14/2020, 12:00 PM

## 2020-07-14 NOTE — Lactation Note (Signed)
This note was copied from a baby's chart. Lactation Consultation Note  Patient Name: Girl Candace Lowe [id number] Date: 07/14/2020 Reason for consult: Initial assessment;Early term 37-38.6wks;Maternal endocrine disorder;Primapara;1st time breastfeeding Age:28 hours  Visiting with mom of 3 hours old ETI female, she's a P1 and reported (+) breast changes during the pregnancy. Night shift RN Brooke called LC for assistance, mom was awake and baby was feeding at the breast. Noticed that baby was nursing in cradle hold when entering the room, but she had lost some depth already.  LC repositioned baby but mom required almost full assistance because she was shaking, RN notified. Reviewed hand expression after feeding at the breast, she was worried that baby "didn't get enough" LC and LC student Lia able to get colostrum and finger fed baby. Reviewed normal newborn behavior, feeding cues, cluster feeding, size of baby's stomach and lactogenesis II.  Mom was still sleepy and she may need some refresher with the education provided. Mom and baby doing STS when exiting the room.  Feeding plan:   1. Encouraged mom to feed baby STS 8-12 times/24 hours or sooner if feeding cues are present 2. Hand expression and finger feeding were also encouraged  Mom needs to have a BF brochure, LC forgot to bring one for this initial consultation. No support person in mom's room at the time of [facility] consultation. Mom reported all questions and concerns were answered, she's aware of LC OP services and will call PRN.   Maternal Data Has patient been taught Hand Expression?: Yes Does the patient have breastfeeding experience prior to this delivery?: No  Feeding Mother's Current Feeding Choice: Breast Milk  LATCH Score Latch: Repeated attempts needed to sustain latch, nipple held in mouth throughout feeding, stimulation needed to elicit sucking reflex.  Audible Swallowing: A few with stimulation  Type of Nipple: Everted  at rest and after stimulation  Comfort (Breast/Nipple): Soft / non-tender  Hold (Positioning): Assistance needed to correctly position infant at breast and maintain latch.  LATCH Score: 7   Lactation Tools Discussed/Used    Interventions Interventions: Breast feeding basics reviewed;Assisted with latch;Skin to skin;Breast massage;Hand express;Breast compression;Adjust position  Discharge Pump: Personal (DEBP) WIC Program: Yes  Consult Status Consult Status: Follow-up Date: 07/15/20 Follow-up type: In-patient    Milos Milligan Venetia Constable 07/14/2020, 11:08 PM

## 2020-07-14 NOTE — Discharge Summary (Signed)
Postpartum Discharge Summary  Date of Service updated 07/16/20     Patient Name: Candace Lowe DOB: 03/02/1991 MRN: [id number]  Date of admission: 07/14/2020 Delivery date:07/14/2020  Delivering provider: Manya Silvas  Date of discharge: 07/16/2020  Admitting diagnosis: Leakage of amniotic fluid [O42.90] Intrauterine pregnancy: 103w5d    Secondary diagnosis:  Active Problems:   Gestational diabetes   GBS (group B Streptococcus carrier), +RV culture, currently pregnant   Leakage of amniotic fluid   Mild preeclampsia, third trimester   Vaginal delivery  Additional problems: none    Discharge diagnosis: Term Pregnancy Delivered, Preeclampsia (mild) and GDM A1                                              Post partum procedures:n/a Augmentation: None Complications: None  Hospital course: Onset of Labor With Vaginal Delivery      30y.o. yo G2P0010 at 28w5das admitted in Latent Labor on 07/14/2020. Patient had an uncomplicated labor course as follows:  Membrane Rupture Time/Date: 11:00 AM ,07/14/2020   Delivery Method:Vaginal, Spontaneous  Episiotomy: None  Lacerations:  Labial;1st degree  Patient had an uncomplicated postpartum course.  She is ambulating, tolerating a regular diet, passing flatus, and urinating well. Patient is discharged home in stable condition on 07/16/20.  Newborn Data: Birth date:07/14/2020  Birth time:7:40 PM  Gender:Female  Living status:Living  Apgars:8 ,9  Weight:3371 g   Magnesium Sulfate received: No BMZ received: No Rhophylac:N/A MMR:N/A T-DaP:offered prior to discharge Flu: No Transfusion:No  Physical exam  Vitals:   07/15/20 1012 07/15/20 1333 07/15/20 2052 07/16/20 0520  BP: 121/81 124/85 113/84 130/85  Pulse: (!) 56 98 71 60  Resp:   18 20  Temp: 98.7 F (37.1 C) 98.4 F (36.9 C) 98.6 F (37 C) 97.9 F (36.6 C)  TempSrc: Oral Oral Oral Oral  SpO2:  (!) 68% 100% 100%  Weight:      Height:       General: alert, cooperative  and no distress Lochia: appropriate Uterine Fundus: firm Incision: N/A DVT Evaluation: No evidence of DVT seen on physical exam. Labs: Lab Results  Component Value Date   WBC 18.0 (H) 07/15/2020   HGB 10.2 (L) 07/15/2020   HCT 32.3 (L) 07/15/2020   MCV 79.2 (L) 07/15/2020   PLT 204 07/15/2020   CMP Latest Ref Rng & Units 07/14/2020  Glucose 70 - 99 mg/dL 91  BUN 6 - 20 mg/dL 7  Creatinine 0.44 - 1.00 mg/dL 0.78  Sodium 135 - 145 mmol/L 133(L)  Potassium 3.5 - 5.1 mmol/L 4.2  Chloride 98 - 111 mmol/L 106  CO2 22 - 32 mmol/L 18(L)  Calcium 8.9 - 10.3 mg/dL 8.8(L)  Total Protein 6.5 - 8.1 g/dL 6.5  Total Bilirubin 0.3 - 1.2 mg/dL 0.6  Alkaline Phos 38 - 126 U/L 160(H)  AST 15 - 41 U/L 21  ALT 0 - 44 U/L 10   Edinburgh Score: Edinburgh Postnatal Depression Scale Screening Tool 07/14/2020  I have been able to laugh and see the funny side of things. (No Data)     After visit meds:  Allergies as of 07/16/2020      Reactions   Mushroom Extract Complex Other (See Comments)   "throat hurts"      Medication List    STOP taking these medications   Accu-Chek  Guide w/Device Kit   Accu-Chek Softclix Lancets lancets   glucose blood test strip   vitamin C 100 MG tablet     TAKE these medications   acetaminophen 500 MG tablet Commonly known as: TYLENOL Take 2 tablets (1,000 mg total) by mouth every 6 (six) hours as needed for mild pain or moderate pain (for pain scale < 4).   amLODipine 5 MG tablet Commonly known as: NORVASC Take 1 tablet (5 mg total) by mouth daily.   coconut oil Oil Apply 1 application topically as needed.   ibuprofen 600 MG tablet Commonly known as: ADVIL Take 1 tablet (600 mg total) by mouth every 6 (six) hours as needed for mild pain or moderate pain.   polyethylene glycol powder 17 GM/SCOOP powder Commonly known as: GLYCOLAX/MIRALAX Take 17 g by mouth daily as needed.   Prenatal Vitamins 28-0.8 MG Tabs Take 1 capsule by mouth daily.         Discharge home in stable condition Infant Feeding: No evidence of DVT seen on physical exam. Infant Disposition:home with mother Discharge instruction: per After Visit Summary and Postpartum booklet. Activity: Advance as tolerated. Pelvic rest for 6 weeks.  Diet: routine diet Future Appointments: Future Appointments  Date Time Provider [place]  07/18/2020  1:35 PM [facility]  07/22/2020  9:50 AM [facility] None   Follow up Visit: Message sent to [facility] 07/16/20 by Sylvester Harder.   Please schedule this patient for a In person postpartum visit in 4 weeks with the following provider: Any provider. Additional Postpartum F/U:BP check 1 week in person High risk pregnancy complicated by: Mild Pre-E Delivery mode:  Vaginal, Spontaneous  Anticipated Birth Control:  declines   0/06/8900 Arrie Senate, MD

## 2020-07-15 LAB — CBC
HCT: 32.3 % — ABNORMAL LOW (ref 36.0–46.0)
Hemoglobin: 10.2 g/dL — ABNORMAL LOW (ref 12.0–15.0)
MCH: 25 pg — ABNORMAL LOW (ref 26.0–34.0)
MCHC: 31.6 g/dL (ref 30.0–36.0)
MCV: 79.2 fL — ABNORMAL LOW (ref 80.0–100.0)
Platelets: 204 10*3/uL (ref 150–400)
RBC: 4.08 MIL/uL (ref 3.87–5.11)
RDW: 14 % (ref 11.5–15.5)
WBC: 18 10*3/uL — ABNORMAL HIGH (ref 4.0–10.5)
nRBC: 0 % (ref 0.0–0.2)

## 2020-07-15 LAB — RPR: RPR Ser Ql: NONREACTIVE

## 2020-07-15 MED ORDER — AMLODIPINE BESYLATE 5 MG PO TABS
5.0000 mg | ORAL_TABLET | Freq: Every day | ORAL | Status: DC
  Administered 2020-07-15 – 2020-07-16 (×2): 5 mg via ORAL
  Filled 2020-07-15 (×2): qty 1

## 2020-07-15 MED ORDER — INFLUENZA VAC SPLIT QUAD 0.5 ML IM SUSY
0.5000 mL | PREFILLED_SYRINGE | INTRAMUSCULAR | Status: AC
  Administered 2020-07-16: 0.5 mL via INTRAMUSCULAR
  Filled 2020-07-15: qty 0.5

## 2020-07-15 NOTE — Progress Notes (Addendum)
POSTPARTUM PROGRESS NOTE  Subjective: Candace Lowe is a 30 y.o. G2P1011 on PPD#1 s/p SVD at [redacted]w[redacted]d A1GDM.  She reports she is doing well. No acute events overnight. She denies any problems with voiding or po intake. She needs assistance walking. She reports mild pain in her back and epigastric region, which is well controlled. Denies headaches, blurry vision, or RUQ pain. She has passed flatus. Lochia is appropriate.  Objective: Blood pressure 124/85, pulse 98, temperature 98.4 F (36.9 C), temperature source Oral, resp. rate 20, height $RemoveBe'5\' 4"'MNyZjlKML$  (1.626 m), weight 58.5 kg, last menstrual period 10/17/2019   Physical Exam:  General: alert, cooperative and no distress Psych: flat affect Chest: no respiratory distress Abdomen: soft, non-tender  Uterine Fundus: firm and at level of umbilicus Extremities: no calf swelling or tenderness  no edema  Recent Labs    07/14/20 1221 07/15/20 0549  HGB 11.8* 10.2*  HCT 37.3 32.3*    Assessment/Plan: Candace Lowe is a 30 y.o. G2P1011 on PPD#1 s/p SVD at [redacted]w[redacted]d for postpartum care complicated by C0PZZ.  Routine Postpartum Care: Doing well, pain well-controlled.  -- Continue routine care, lactation support  -- Contraception: Undecided -- Feeding: Breast  -- A1GDM: CBG of 158 on PPD#1. Will order FBG on PPD#2. -- Question of paternity: met with SW, who considered that paternity must be determined by a third party chosen by family. -- Hypertension: adequately treated with Norvasc 5 mg -- Anemia: adequately treated with prenatal multivitamin -- History of depression: diagnosed with seasonal depression in 2017 or 2018. Patient stated she has not experienced depression during the pregnancy. SW identified no need for intervention and no barriers to discharge.   Dispo: Plan for discharge on PPD#2.  Rennis Chris, Medical Student 07/15/2020 4:53 PM   I saw and evaluated the patient. I repeated all components of HPI and physical exam. I agree  with the findings and the plan of care as documented in the medical student's note.  Sharene Skeans, MD [facility] Family Medicine Fellow, [facility] for [facility], [place]

## 2020-07-15 NOTE — Social Work (Signed)
CSW consulted for question of paternity and possible food insecurity. CSW reviewed chart and notes MOB has a diagnosis of seasonal disorder.   CSW met with MOB to offer support and complete assessment.CSW did not address paternity, considering it must be determined by a third party chosen by family. CSW introduced self and role. CSW observed infant in bassinet and FOB sleeping on couch. CSW offered to return to speak with MOB in private, MOB declined. MOB had a flat affect and spoke very softly. CSW asked MOB how she is dong postpartum. MOB stated "I don't know how I'm feeling." CSW asked MOB if she is feeling bonded with infant, MOB stated yes. CSW asked MOB about her mental health diagnosis. 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 MOB stated she does not believe she experienced depression during the pregnancy. MOB shared she did experience some adjustments after no longer working and having to depend on FOB. MOB disclosed she attended therapy to treat depression two or three years ago and has never been on medication. MOB stated she identifies her father as a support. CSW asked MOB if she considers herself to have a strong support system. MOB stated "it depends on the day." MOB denies any current SI or HI.   CSW provided education regarding the baby blues period versus PPD and provided resources. CSW provided the New Mom Checklist and encouraged MOB to self evaluate and contact a medical professional if symptoms are noted at any time.  CSW provided review of Sudden Infant Death Syndrome (SIDS) precautions. MOB reported she has all essentials for infant including a car seat. MOB is still in the process of identifying a pediatrician and denies any barriers to care. MOB reported all of her basic needs (housing, food, transportation etc) are being met and denies any needs. MOB receives WIC and received food stamps for a short period of time during pregnancy.  CSW suggested MOB reach out to DSS to inquire on receiving benefits again. MOB was open to a referral for Healthy Start.     CSW identifies no further need for intervention and no barriers to discharge at this time.  Almer Littleton, LCSWA Clinical Social Work [facility] [phone] 

## 2020-07-15 NOTE — Lactation Note (Signed)
This note was copied from a baby's chart. Lactation Consultation Note  Patient Name: Candace Lowe [id number] Date: 07/15/2020 Reason for consult: Follow-up assessment Age:28 hours  P1 mother whose infant is now 33 hours old.  This is an ETI at 38+5 weeks.    Baby had breast fed for 8 minutes < 1 hour prior to my arrival.  RN in room when I arrived and assisting mother.  Baby had a stool and mother was getting ready to learn how to change a diaper.  I offered to assist while speaking with mother regarding breast feeding.  Mother needed much guidance and support with the diaper change.  Reinforced basic breast feeding concepts with mother.  Reviewed feeding cues.    Suggested mother call her RN/LC for latch assistance.  Mother seems unsure on how to handle baby.  RN provided much guidance on how to hold and turn baby to properly position her for the diaper change.  Continue to reinforce basic concepts.  Mother knows how to call RN/LC as needed.  RN reported father has been doing the diaper changes but was not currently in the room.     Maternal Data    Feeding    LATCH Score                    Lactation Tools Discussed/Used    Interventions    Discharge    Consult Status Consult Status: Follow-up Date: 07/16/20 Follow-up type: In-patient    Candace Lowe 07/15/2020, 1:59 PM

## 2020-07-15 NOTE — Progress Notes (Signed)
Notified Dr. Nobie Putnam of pt's status since arriving to floor from L&D at 2210. Pt has been having pronounced and uncontrollable "shivers" intermittently since arrival and is quite drowsy, falling asleep quickly. Pt denies feeling cold. L&D RN reported off that pt had been doing this intermittently since delivery. Pt had 100 mcg fentanyl at 1947 and 0.5mg  dilaudid at 2020. Per report from L&D RN, pt was drowsy and sleepy in L&D and prior to arrival to Mother/Baby Unit. CBG checked at 2252 and was 158. Pt states she drank a lot of juice after delivery. Pt has been made aware of fall safety concerns for her and baby, awaiting FOB to return to hospital. No new orders at this time. RN with pt for assessment and to take pt to bathroom.  Received call back from Dr. Nobie Putnam at 2311 while assisting pt in bathroom. Pt is not to receive any further narcotics. Pulse ox just checked at 2302 and was 98% on room air. Reviewed most recent BP of 154/88 at 2302. BP to be rechecked after pt is back in bed and at rest for a little while. No new orders received. Will continue to closely monitor pt and notify MD if any further changes. Pt and FOB aware that she is not to get out of bed without staff assistance for now. Reviewed fall safety precautions for baby as well and that pt must not fall asleep if holding baby. BP recheck at 2339 144/89.

## 2020-07-15 NOTE — Lactation Note (Signed)
This note was copied from a baby's chart. Lactation Consultation Note  Patient Name: Candace Lowe EHOZY'Y Date: 07/15/2020 Reason for consult: Follow-up assessment Age:28 hours  Follow up visit to 25 hours infant, per mother's request. Mother is a primipara, first-time breastfeeding.   FOI is holding infant upon arrival. LC noticed void and changed diaper. Infant is showing hunger cues. LC offered assistance with hand expression and able to see a couple of drops. Mother requests assistance with latch. Set up support pillows for football position to right breast. Demonstrated alignment. Latched infant with ease. Noted suckling and audible swallowing. Mother denies discomfort or pain.   Educated mom about deep latch for an optimal breastfeeding session.  Plan:   1-Breastfeeding on demand, ensuring a deep, comfortable latch.  2-Undressing infant and place skin to skin when ready to breastfeed 3-Keep infant awake during breastfeeding session: massaging breast, infant's hand/shoulder/feet 4-Monitor voids and stools as signs good intake.  5-Contact LC as needed for feeds/support/concerns/questions   Maternal Data Has patient been taught Hand Expression?: Yes  Feeding Mother's Current Feeding Choice: Breast Milk  LATCH Score Latch: Grasps breast easily, tongue down, lips flanged, rhythmical sucking.  Audible Swallowing: Spontaneous and intermittent  Type of Nipple: Everted at rest and after stimulation  Comfort (Breast/Nipple): Soft / non-tender  Hold (Positioning): Assistance needed to correctly position infant at breast and maintain latch.  LATCH Score: 9   Lactation Tools Discussed/Used Tools: Pump Breast pump type: Manual Reason for Pumping: mother's request  Interventions Interventions: Breast feeding basics reviewed;Assisted with latch;Skin to skin;Breast massage;Hand express;Adjust position;Expressed milk;Position options;Support pillows;Hand  pump;Education  Discharge Pump: Manual WIC Program: Yes  Consult Status Consult Status: Follow-up Date: 07/16/20 Follow-up type: In-patient    Candace Lowe 07/15/2020, 9:39 PM

## 2020-07-15 NOTE — Lactation Note (Signed)
This note was copied from a baby's chart. Lactation Consultation Note Baby 7 hrs old.  Attempted to BF. Baby sleepy. Checked diaper to stimulate baby. Baby fussing but stopped once in moms arms. Baby had no interest in BF at all. LC did get the nipple in baby's mouth. Baby chewed on it. Wouldn't suck on LC's gloved finger. Placed baby STS on moms chest and covered baby. Pulled rails up encouraged mom to call for assistance if needed. Mom still shaking at intervals.  Patient Name: Candace Lowe Date: 07/15/2020 Reason for consult: Follow-up assessment;Primapara;Early term 37-38.6wks Age:28 hours  Maternal Data    Feeding    LATCH Score Latch: Too sleepy or reluctant, no latch achieved, no sucking elicited.  Audible Swallowing: None  Type of Nipple: Everted at rest and after stimulation  Comfort (Breast/Nipple): Soft / non-tender  Hold (Positioning): Full assist, staff holds infant at breast  LATCH Score: 4   Lactation Tools Discussed/Used    Interventions    Discharge    Consult Status Consult Status: Follow-up Date: 07/15/20 Follow-up type: In-patient    Charyl Dancer 07/15/2020, 3:23 AM

## 2020-07-16 ENCOUNTER — Other Ambulatory Visit: Payer: Self-pay | Admitting: Family Medicine

## 2020-07-16 LAB — GLUCOSE, CAPILLARY: Glucose-Capillary: 71 mg/dL (ref 70–99)

## 2020-07-16 MED ORDER — AMLODIPINE BESYLATE 5 MG PO TABS: 5.0000 mg | ORAL_TABLET | Freq: Every day | ORAL | 3 refills | Status: DC

## 2020-07-16 MED ORDER — COCONUT OIL OIL: 1.0000 "application " | TOPICAL_OIL | 0 refills | Status: DC | PRN

## 2020-07-16 MED ORDER — ACETAMINOPHEN 500 MG PO TABS: 1000.0000 mg | ORAL_TABLET | Freq: Four times a day (QID) | ORAL | Status: DC | PRN

## 2020-07-16 MED ORDER — IBUPROFEN 600 MG PO TABS: 600.0000 mg | ORAL_TABLET | Freq: Four times a day (QID) | ORAL | 0 refills | Status: DC | PRN

## 2020-07-16 MED FILL — AMLODIPINE BESYLATE 5 MG TA: 5 | 30 days supply | Qty: 30 | Fill #0

## 2020-07-16 NOTE — Discharge Instructions (Signed)
-take tylenol 1000 mg every 6 hours as needed for pain, alternate with ibuprofen 600 mg every 6 hours -drink plenty of water to help with breastfeeding -continue prenatal vitamins while you are breastfeeding -take iron pills every other day with vitamin c, this will help healing as well as breast feeding -think about birth control options-->bedisider.org is a great website! You can get any form of birth control from the health department for free   Postpartum Care After Vaginal Delivery The following information offers guidance about how to care for yourself from the time you deliver your baby to 6-12 weeks after delivery (postpartum period). If you have problems or questions, contact your health care provider for more specific instructions. Follow these instructions at home: Vaginal bleeding  It is normal to have vaginal bleeding (lochia) after delivery. Wear a sanitary pad for bleeding and discharge. ? During the first week after delivery, the amount and appearance of lochia is often similar to a menstrual period. ? Over the next few weeks, it will gradually decrease to a dry, yellow-brown discharge. ? For most women, lochia stops completely by 4-6 weeks after delivery, but can vary.  Change your sanitary pads frequently. Watch for any changes in your flow, such as: ? A sudden increase in volume. ? A change in color. ? Large blood clots.  If you pass a blood clot from your vagina, save it and call your health care provider. Do not flush blood clots down the toilet before talking with your health care provider.  Do not use tampons or douches until your health care provider approves.  If you are not breastfeeding, your period should return 6-8 weeks after delivery. If you are feeding your baby breast milk only, your period may not return until you stop breastfeeding. Perineal care  Keep the area between the vagina and the anus (perineum) clean and dry. Use medicated pads and  pain-relieving sprays and creams as directed.  If you had a surgical cut in the perineum (episiotomy) or a tear, check the area for signs of infection until you are healed. Check for: ? More redness, swelling, or pain. ? Fluid or blood coming from the cut or tear. ? Warmth. ? Pus or a bad smell.  You may be given a squirt bottle to use instead of wiping to clean the perineum area after you use the bathroom. Pat the area gently to dry it.  To relieve pain caused by an episiotomy, a tear, or swollen veins in the anus (hemorrhoids), take a warm sitz bath 2-3 times a day. In a sitz bath, the warm water should only come up to your hips and cover your buttocks.   Breast care  In the first few days after delivery, your breasts may feel heavy, full, and uncomfortable (breast engorgement). Milk may also leak from your breasts. Ask your health care provider about ways to help relieve the discomfort.  If you are breastfeeding: ? Wear a bra that supports your breasts and fits well. Use breast pads to absorb milk that leaks. ? Keep your nipples clean and dry. Apply creams and ointments as told. ? You may have uterine contractions every time you breastfeed for up to several weeks after delivery. This helps your uterus return to its normal size. ? If you have any problems with breastfeeding, notify your health care provider or lactation consultant.  If you are not breastfeeding: ? Avoid touching your breasts. Do not squeeze out (express) milk. Doing this can make your   breasts produce more milk. ? Wear a good-fitting bra and use cold packs to help with swelling. Intimacy and sexuality  Ask your health care provider when you can engage in sexual activity. This may depend upon: ? Your risk of infection. ? How fast you are healing. ? Your comfort and desire to engage in sexual activity.  You are able to get pregnant after delivery, even if you have not had your period. Talk with your health care provider  about methods of birth control (contraception) or family planning if you desire future pregnancies. Medicines  Take over-the-counter and prescription medicines only as told by your health care provider.  Take an over-the-counter stool softener to help ease bowel movements as told by your health care provider.  If you were prescribed an antibiotic medicine, take it as told by your health care provider. Do not stop taking the antibiotic even if you start to feel better.  Review all previous and current prescriptions to check for possible transfer into breast milk. Activity  Gradually return to your normal activities as told by your health care provider.  Rest as much as possible. Nap while your baby is sleeping. Eating and drinking  Drink enough fluid to keep your urine pale yellow.  To help prevent or relieve constipation, eat high-fiber foods every day.  Choose healthy eating to support breastfeeding or weight loss goals.  Take your prenatal vitamins until your health care provider tells you to stop.   General tips/recommendations  Do not use any products that contain nicotine or tobacco. These products include cigarettes, chewing tobacco, and vaping devices, such as e-cigarettes. If you need help quitting, ask your health care provider.  Do not drink alcohol, especially if you are breastfeeding.  Do not take medications or drugs that are not prescribed to you, especially if you are breastfeeding.  Visit your health care provider for a postpartum checkup within the first 3-6 weeks after delivery.  Complete a comprehensive postpartum visit no later than 12 weeks after delivery.  Keep all follow-up visits for you and your baby. Contact a health care provider if:  You feel unusually sad or worried.  Your breasts become red, painful, or hard.  You have a fever or other signs of an infection.  You have bleeding that is soaking through one pad an hour or you have blood  clots.  You have a severe headache that doesn't go away or you have vision changes.  You have nausea and vomiting and are unable to eat or drink anything for 24 hours. Get help right away if:  You have chest pain or difficulty breathing.  You have sudden, severe leg pain.  You faint or have a seizure.  You have thoughts about hurting yourself or your baby. If you ever feel like you may hurt yourself or others, or have thoughts about taking your own life, get help right away. Go to your nearest emergency department or:  Call your local emergency services (911 in the [place]  The National Suicide Prevention Lifeline at 1-800-273-8255. This suicide crisis helpline is open 24 hours a day.  Text the Crisis Text Line at 741741 (in the [place] Summary  The period of time after you deliver your newborn up to 6-12 weeks after delivery is called the postpartum period.  Keep all follow-up visits for you and your baby.  Review all previous and current prescriptions to check for possible transfer into breast milk.  Contact a health care provider if you feel   unusually sad or worried during the postpartum period. This information is not intended to replace advice given to you by your health care provider. Make sure you discuss any questions you have with your health care provider. Document Revised: 01/11/2020 Document Reviewed: 01/11/2020 Elsevier Patient Education  2021 Elsevier Inc.  

## 2020-07-16 NOTE — Lactation Note (Addendum)
This note was copied from a baby's chart. Lactation Consultation Note  Patient Name: Candace Lowe PYPPJ'K Date: 07/16/2020 Reason for consult: Follow-up assessment Age:28 hours   Mother had infant latched when I arrived to the room. Infant was poorly latched and mother complained of slight discomfort. Mother reports that she could tell infant was chewing at the breast.  Assist mother with getting infant latched on deeper in a cross cradle hold. Infant was held closer to breast.  Observed burst of suckling and swallows.  Lots of teaching with parents. Discussed cue base and cluster feeding.  Informed mother of milk coming to volume and prevention and tx of engorgement.  Mother to continue to cue base feed infant and feed at least 8-12 times or more in 24 hours and advised to allow for cluster feeding infant as needed.  Mother to continue to due STS. Mother is aware of available LC services at [facility], BFSG'S, OP Dept, and phone # for questions or concerns about breastfeeding.  Mother receptive to all teaching and plan of care.    Maternal Data    Feeding Mother's Current Feeding Choice: Breast Milk  LATCH Score Latch: Grasps breast easily, tongue down, lips flanged, rhythmical sucking.  Audible Swallowing: A few with stimulation  Type of Nipple: Everted at rest and after stimulation  Comfort (Breast/Nipple): Filling, red/small blisters or bruises, mild/mod discomfort  Hold (Positioning): Assistance needed to correctly position infant at breast and maintain latch.  LATCH Score: 7   Lactation Tools Discussed/Used    Interventions Interventions: Adjust position;Breast compression;Hand express;Skin to skin;Assisted with latch;Breast feeding basics reviewed;Support pillows;Position options;Hand pump;Comfort gels;Education  Discharge Discharge Education: Engorgement and breast care;Warning signs for feeding baby  Consult Status      Stevan Born [facility] 07/16/2020,  9:34 AM

## 2020-07-18 ENCOUNTER — Telehealth (INDEPENDENT_AMBULATORY_CARE_PROVIDER_SITE_OTHER): Payer: Medicaid Other

## 2020-07-18 ENCOUNTER — Encounter: Payer: Medicaid Other | Admitting: Obstetrics & Gynecology

## 2020-07-18 DIAGNOSIS — Z0130 Encounter for examination of blood pressure without abnormal findings: Secondary | ICD-10-CM

## 2020-07-18 DIAGNOSIS — Z658 Other specified problems related to psychosocial circumstances: Secondary | ICD-10-CM

## 2020-07-18 NOTE — Progress Notes (Addendum)
Blood Pressure Check Virtual Visit via Telephone  I connected with Candace Lowe on 07/18/20 at  1:30 PM EST by telephone and verified that I am speaking with the correct person using two identifiers. Approximately 12 minutes was spent with patient on telephone.   I discussed the limitations, risks, security and privacy concerns of performing an evaluation and management service by telephone and the availability of in person appointments. I also discussed with the patient that there may be a patient responsible charge related to this service. The patient expressed understanding and agreed to proceed.  Patient here for blood pressure check following spontaneous vaginal on 07/14/20. BP today is 122/84, HR 87. Patient denies any s/s of elevated BP. Reviewed with Debroah Loop, MD who states pt may continue Norvasc 5 mg and follow up at in person BP check in 1 week. Pt states the recovery process is going well. Endorses some support at home. PP depression screening is negative; however, pt would like to check in with [facility] in the upcoming weeks. Referral placed.  Marjo Bicker, RN 07/18/2020  1:32 PM   Addend:  Pt located at home and provider located at [facility].  Fleet Contras RN 07/22/20

## 2020-07-19 NOTE — Addendum Note (Signed)
Addended by: Maxwell Marion E on: 07/19/2020 10:23 AM   Modules accepted: Orders

## 2020-07-22 ENCOUNTER — Other Ambulatory Visit (HOSPITAL_COMMUNITY): Payer: Medicaid Other

## 2020-07-23 ENCOUNTER — Inpatient Hospital Stay (HOSPITAL_COMMUNITY)
Admission: AD | Admit: 2020-07-23 | Payer: Medicaid Other | Source: Home / Self Care | Admitting: Obstetrics and Gynecology

## 2020-07-23 ENCOUNTER — Inpatient Hospital Stay (HOSPITAL_COMMUNITY): Payer: Medicaid Other

## 2020-07-25 ENCOUNTER — Ambulatory Visit: Payer: Medicaid Other

## 2020-08-05 NOTE — BH Specialist Note (Signed)
Pt did not arrive to video visit and did not answer the phone ; Left HIPPA-compliant message to call back Jaylise Peek from [facility] at [phone] (main office) or [phone] (Vasilia Dise's office).  ; left MyChart message for patient.      

## 2020-08-06 ENCOUNTER — Encounter: Payer: Self-pay | Admitting: General Practice

## 2020-08-09 ENCOUNTER — Ambulatory Visit: Payer: Self-pay | Admitting: Clinical

## 2020-08-09 ENCOUNTER — Telehealth: Payer: Self-pay | Admitting: Lactation Services

## 2020-08-09 DIAGNOSIS — Z5329 Procedure and treatment not carried out because of patient's decision for other reasons: Secondary | ICD-10-CM

## 2020-08-09 DIAGNOSIS — Z91199 Patient's noncompliance with other medical treatment and regimen due to unspecified reason: Secondary | ICD-10-CM

## 2020-08-09 MED ORDER — NYSTATIN 100000 UNIT/GM EX CREA: TOPICAL_CREAM | CUTANEOUS | 1 refills | Status: DC

## 2020-08-09 NOTE — Telephone Encounter (Signed)
Returned mom's call. She did not answer. Mail box full and not able to leave a message.  Will send My Chart Message.

## 2020-08-12 ENCOUNTER — Ambulatory Visit: Payer: Medicaid Other | Admitting: Nurse Practitioner

## 2020-08-26 NOTE — BH Specialist Note (Signed)
Integrated Behavioral Health via Telemedicine Visit  08/26/2020 [name] [id number]  Number of Integrated Behavioral Health visits: 1 Session Start time: 9:15  Session End time: 10:14 Total time: 75  Referring Provider: Scheryl Darter, MD Patient/Family location: Home [facility] Provider location: [facility]  All persons participating in visit: Patient Candace Lowe and [facility] Isobella Ascher   Types of Service: Individual psychotherapy and Video visit  I connected with Candace Lowe and/or Candace Lowe n/a via  Telephone or Video Enabled Telemedicine Application  (Video is Caregility application) and verified that I am speaking with the correct person using two identifiers. Discussed confidentiality: Yes   I discussed the limitations of telemedicine and the availability of in person appointments.  Discussed there is a possibility of technology failure and discussed alternative modes of communication if that failure occurs.  I discussed that engaging in this telemedicine visit, they consent to the provision of behavioral healthcare and the services will be billed under their insurance.  Patient and/or legal guardian expressed understanding and consented to Telemedicine visit: Yes   Presenting Concerns: Patient and/or family reports the following symptoms/concerns: Pt states her primary concern today is fatigue and adjusting to new motherhood with lack of adequate support at home; copes by cleaning/organizing; is thinking about getting out of the house to walk with baby, but difficulty getting stroller down three flights of stairs.  Duration of problem: Postpartum; Severity of problem: moderate  Patient and/or Family's Strengths/Protective Factors: Social connections, Concrete supports in place (healthy food, safe environments, etc.), Sense of purpose and Physical Health (exercise, healthy diet, medication compliance,  etc.)  Goals Addressed: Patient will: 1.  Reduce symptoms of: anxiety and stress  2.  Increase knowledge and/or ability of: stress reduction  3.  Demonstrate ability to: Increase healthy adjustment to current life circumstances and Increase adequate support systems for patient/family  Progress towards Goals: Ongoing  Interventions: Interventions utilized:  Solution-Focused Strategies, Psychoeducation and/or Health Education and Link to [employer] Assessments completed: Not Needed  Patient and/or Family Response: Pt agrees with treatment plan  Assessment: Patient currently experiencing Adjustment disorder with anxious and depressed mood.   Patient may benefit from psychoeducation and brief therapeutic interventions regarding coping with symptoms of anxiety, depression, life stress .  Plan: 1. Follow up with behavioral health clinician on : Call [employer] at [phone] 2. Behavioral recommendations:  -Consider registering for new mom support group at www.postpartum.net (has new mom military support as well)or www.conehealthybaby.com  -Consider putting stroller in car trunk, for easier access to take walks with baby (to eliminate need to carry stroller up and down the stairs) -Consider apps, on After Visit Summary, for additional self-care during this time 3. Referral(s): Integrated Art gallery manager (In Clinic) and [employer]:  new mom support  I discussed the assessment and treatment plan with the patient and/or parent/guardian. They were provided an opportunity to ask questions and all were answered. They agreed with the plan and demonstrated an understanding of the instructions.   They were advised to call back or seek an in-person evaluation if the symptoms worsen or if the condition fails to improve as anticipated.  Rae Lips, LCSW   Depression screen [facility] 2/9 07/11/2020 07/03/2020 05/23/2020 03/27/2020 02/29/2020  Decreased Interest 1  2 0 0 1  Down, Depressed, Hopeless 0 0 0 0 1  PHQ - 2 Score 1 2 0 0 2  Altered sleeping 1 1 0 0  1  Tired, decreased energy 1 1 0 0 2  Change in appetite 0 0 0 0 2  Feeling bad or failure about yourself  0 0 0 0 0  Trouble concentrating 0 0 0 0 0  Moving slowly or fidgety/restless 0 0 0 0 0  Suicidal thoughts 0 0 0 0 0  PHQ-9 Score 3 4 0 0 7   GAD 7 : Generalized Anxiety Score 07/11/2020 07/03/2020 05/23/2020 03/27/2020  Nervous, Anxious, on Edge 0 0 0 0  Control/stop worrying 0 0 0 0  Worry too much - different things 0 0 0 0  Trouble relaxing 1 1 0 0  Restless 0 0 0 0  Easily annoyed or irritable 1 1 0 0  Afraid - awful might happen 0 0 0 0  Total GAD 7 Score 2 2 0 0

## 2020-09-03 ENCOUNTER — Ambulatory Visit (INDEPENDENT_AMBULATORY_CARE_PROVIDER_SITE_OTHER): Payer: Medicaid Other | Admitting: Clinical

## 2020-09-03 DIAGNOSIS — O99345 Other mental disorders complicating the puerperium: Secondary | ICD-10-CM

## 2020-09-03 DIAGNOSIS — Z658 Other specified problems related to psychosocial circumstances: Secondary | ICD-10-CM

## 2020-09-03 DIAGNOSIS — F4323 Adjustment disorder with mixed anxiety and depressed mood: Secondary | ICD-10-CM

## 2020-09-03 NOTE — Patient Instructions (Signed)
[facility] [address] , [place] [address] [phone] (main office) [phone] (Pavlos Yon's office)  /Emotional Wellbeing Apps and Websites Here are a few free apps meant to help you to help yourself.  To find, try searching on the internet to see if the app is offered on Apple/Android devices. If your first choice doesn't come up on your device, the good news is that there are many choices! Play around with different apps to see which ones are helpful to you.    Calm This is an app meant to help increase calm feelings. Includes info, strategies, and tools for tracking your feelings.      Calm Harm  This app is meant to help with self-harm. Provides many 5-minute or 15-min coping strategies for doing instead of hurting yourself.       Healthy Minds Health Minds is a problem-solving tool to help deal with emotions and cope with stress you encounter wherever you are.      MindShift This app can help people cope with anxiety. Rather than trying to avoid anxiety, you can make an important shift and face it.      MY3  MY3 features a support system, safety plan and resources with the goal of offering a tool to use in a time of need.       My Life My Voice  This mood journal offers a simple solution for tracking your thoughts, feelings and moods. Animated emoticons can help identify your mood.       Relax Melodies Designed to help with sleep, on this app you can mix sounds and meditations for relaxation.      Smiling Mind Smiling Mind is meditation made easy: it's a simple tool that helps put a smile on your mind.        Stop, Breathe & Think  A friendly, simple guide for people through meditations for mindfulness and compassion.  Stop, Breathe and Think Kids Enter your current feelings and choose a "mission" to help you cope. Offers videos for certain moods instead of just sound recordings.       Team  Orange The goal of this tool is to help teens change how they think, act, and react. This app helps you focus on your own good feelings and experiences.      The Virtual Hope Box The Virtual Hope Box (VHB) contains simple tools to help patients with coping, relaxation, distraction, and positive thinking.     

## 2020-09-11 ENCOUNTER — Telehealth (INDEPENDENT_AMBULATORY_CARE_PROVIDER_SITE_OTHER): Payer: Medicaid Other | Admitting: Family Medicine

## 2020-09-11 NOTE — Progress Notes (Signed)
Patient unable to come in person but requires pap, rescheduled

## 2021-09-10 ENCOUNTER — Ambulatory Visit (HOSPITAL_COMMUNITY): Payer: Self-pay

## 2021-09-11 ENCOUNTER — Ambulatory Visit (HOSPITAL_COMMUNITY): Payer: Self-pay

## 2021-09-15 ENCOUNTER — Encounter (HOSPITAL_COMMUNITY): Payer: Self-pay

## 2021-09-15 ENCOUNTER — Other Ambulatory Visit: Payer: Self-pay | Admitting: Obstetrics and Gynecology

## 2021-09-15 ENCOUNTER — Ambulatory Visit (HOSPITAL_COMMUNITY)
Admission: RE | Admit: 2021-09-15 | Discharge: 2021-09-15 | Disposition: A | Discharge status: Home / Self Care | Payer: Medicaid Other | Source: Ambulatory Visit | Attending: Internal Medicine | Admitting: Internal Medicine

## 2021-09-15 VITALS — BP 100/71 | HR 53 | Temp 98.4°F | Resp 18

## 2021-09-15 DIAGNOSIS — N644 Mastodynia: Secondary | ICD-10-CM | POA: Diagnosis not present

## 2021-09-15 MED ORDER — LIDOCAINE-PRILOCAINE 2.5-2.5 % EX CREA: 1.0000 "application " | TOPICAL_CREAM | CUTANEOUS | 0 refills | Status: DC | PRN

## 2021-09-15 MED ORDER — NYSTATIN 100000 UNIT/ML MT SUSP: 500000.0000 [IU] | Freq: Four times a day (QID) | OROMUCOSAL | 0 refills | Status: AC

## 2021-09-15 NOTE — ED Provider Notes (Signed)
?[facility] ? ? ? ?CSN: [id number] ?Arrival date & time: 09/15/21  1502 ? ? ?  ? ?History   ?Chief Complaint ?Chief Complaint  ?Patient presents with  ? Breast Problem  ?  Entered by patient  ? ? ?HPI ?Candace Lowe is a 31 y.o. female comes to urgent care with complaints of bilateral breast pain which started recently.  Patient has a 560-year-old who is currently breast-feeding.  Patient endorses that she has noticed white patches in the child's mouth recently.  Around the same time she started having burning sensation in the areola and nipple area during breast-feeding.  Pain resolves after breast-feeding is completed.  Pain is burning in nature.  No redness around the nipple area.  No skin breakdown.  No excoriations.  ? ?HPI ? ?Past Medical History:  ?Diagnosis Date  ? Gestational diabetes   ? Mental disorder   ? Seasonal depression (HCC)   ? Vaginal delivery 07/14/2020  ? ? ?Patient Active Problem List  ? Diagnosis Date Noted  ? Leakage of amniotic fluid 07/14/2020  ? Mild preeclampsia, third trimester 07/14/2020  ? Vaginal delivery 07/14/2020  ? [redacted] weeks gestation of pregnancy 07/11/2020  ? GBS (group B Streptococcus carrier), +RV culture, currently pregnant 07/07/2020  ? Gestational diabetes 05/16/2020  ? Low-lying placenta in second trimester 03/01/2020  ? Supervision of other normal pregnancy, antepartum 01/05/2020  ? Low grade squamous intraepithelial lesion on cytologic smear of cervix (LGSIL) 07/07/2016  ? ? ?Past Surgical History:  ?Procedure Laterality Date  ? WISDOM TOOTH EXTRACTION  2017  ? ? ?OB History   ? ? Gravida  ?2  ? Para  ?1  ? Term  ?1  ? Preterm  ?0  ? AB  ?1  ? Living  ?1  ?  ? ? SAB  ?0  ? IAB  ?1  ? Ectopic  ?0  ? Multiple  ?0  ? Live Births  ?1  ?   ?  ?  ? ? ? ?Home Medications   ? ?Prior to Admission medications   ?Medication Sig Start Date End Date Taking? Authorizing Provider  ?lidocaine-prilocaine (EMLA) cream Apply 1 application. topically as needed. 09/15/21  Yes Mikeal Winstanley,  Britta MccreedyPhilip O, MD  ?nystatin (MYCOSTATIN) 100000 UNIT/ML suspension Take 5 mLs (500,000 Units total) by mouth 4 (four) times daily for 10 days. 09/15/21 09/25/21 Yes Stesha Neyens, Britta MccreedyPhilip O, MD  ?acetaminophen (TYLENOL) 500 MG tablet Take 2 tablets (1,000 mg total) by mouth every 6 (six) hours as needed for mild pain or moderate pain (for pain scale < 4). 07/16/20   Alric SetonFirestone, Alicia C, MD  ?amLODipine (NORVASC) 5 MG tablet TAKE 1 TABLET (5 MG TOTAL) BY MOUTH DAILY. 07/16/20 07/16/21  Alric SetonFirestone, Alicia C, MD  ? ? ?Family History ?Family History  ?Problem Relation Age of Onset  ? Diabetes Father   ? Diabetes Paternal Grandmother   ? Diabetes Paternal Grandfather   ? ? ?Social History ?Social History  ? ?Tobacco Use  ? Smoking status: Never  ? Smokeless tobacco: Never  ?Vaping Use  ? Vaping Use: Never used  ?Substance Use Topics  ? Alcohol use: No  ? Drug use: Not Currently  ?  Types: Marijuana  ?  Comment: last time was days ago  ? ? ? ?Allergies   ?Mushroom extract complex ? ? ?Review of Systems ?Review of Systems  ?Musculoskeletal: Negative.   ?Skin: Negative.   ? ? ?Physical Exam ?Triage Vital Signs ?ED Triage Vitals  ?Enc  Vitals Group  ?   BP 09/15/21 1541 100/71  ?   Pulse Rate 09/15/21 1541 (!) 53  ?   Resp 09/15/21 1541 18  ?   Temp 09/15/21 1541 98.4 ?F (36.9 ?C)  ?   Temp src --   ?   SpO2 09/15/21 1541 98 %  ?   Weight --   ?   Height --   ?   Head Circumference --   ?   Peak Flow --   ?   Pain Score 09/15/21 1539 10  ?   Pain Loc --   ?   Pain Edu? --   ?   Excl. in GC? --   ? ?No data found. ? ?Updated Vital Signs ?BP 100/71   Pulse (!) 53   Temp 98.4 ?F (36.9 ?C)   Resp 18   LMP 08/13/2021   SpO2 98%   Breastfeeding Yes  ? ?Visual Acuity ?Right Eye Distance:   ?Left Eye Distance:   ?Bilateral Distance:   ? ?Right Eye Near:   ?Left Eye Near:    ?Bilateral Near:    ? ?Physical Exam ?Vitals and nursing note reviewed. Exam conducted with a chaperone present.  ?Constitutional:   ?   General: She is not in acute distress. ?    Appearance: She is not ill-appearing.  ?Cardiovascular:  ?   Rate and Rhythm: Normal rate and regular rhythm.  ?   Pulses: Normal pulses.  ?   Heart sounds: Normal heart sounds.  ?Musculoskeletal:     ?   General: Normal range of motion.  ?Skin: ?   Comments: No masses palpated in both breasts.  Nipples are normal.  Areolar area is without erythema or excoriations.  ?Neurological:  ?   Mental Status: She is alert.  ? ? ? ?UC Treatments / Results  ?Labs ?(all labs ordered are listed, but only abnormal results are displayed) ?Labs Reviewed - No data to display ? ?EKG ? ? ?Radiology ?No results found. ? ?Procedures ?Procedures (including critical care time) ? ?Medications Ordered in UC ?Medications - No data to display ? ?Initial Impression / Assessment and Plan / UC Course  ?I have reviewed the triage vital signs and the nursing notes. ? ?Pertinent labs & imaging results that were available during my care of the patient were reviewed by me and considered in my medical decision making (see chart for details). ? ?  ? ?1.  Mastalgia: ?Mycostatin suspension to be given to the patient's child until the white patches in the mouth resolves ?Lidocaine cream as needed for burning nipple pain-patient is advised to wash the lidocaine cream completely before breast-feeding ?If pain persist patient is advised to return to urgent care to be reevaluated.  Patient denies any bloody nipple discharge. ?Final Clinical Impressions(s) / UC Diagnoses  ? ?Final diagnoses:  ?Mastalgia  ? ? ? ?Discharge Instructions   ? ?  ?Please use medications as prescribed ?Please give your child nystatin suspension to help with oral candidiasis-continue medication 3 more days after the oral rash is completely gone ?If pain persist please return to urgent care to be reevaluated ?Use the topical numbing medication as prescribed.  Please wash in the nipple area thoroughly before breast-feeding so that the medication does not get into the your child's  mouth ? ? ? ?ED Prescriptions   ? ? Medication Sig Dispense Auth. Provider  ? nystatin (MYCOSTATIN) 100000 UNIT/ML suspension Take 5 mLs (500,000 Units total) by mouth 4 (  four) times daily for 10 days. 60 mL Luba Matzen, Britta Mccreedy, MD  ? lidocaine-prilocaine (EMLA) cream Apply 1 application. topically as needed. 30 g Maranda Marte, Britta Mccreedy, MD  ? ?  ? ?PDMP not reviewed this encounter. ?  ?Merrilee Jansky, MD ?09/15/21 1759 ? ?

## 2021-09-15 NOTE — ED Triage Notes (Signed)
Pt is present today with bilateral breast pain. Pt states that she currently breast feeds and notices a sharp pain after her daughter latches. Pt states that she has been experiencing  for a few months but a few weeks ago is when it became unbearable.  ?

## 2021-09-15 NOTE — Discharge Instructions (Addendum)
Please use medications as prescribed ?Please give your child nystatin suspension to help with oral candidiasis-continue medication 3 more days after the oral rash is completely gone ?If pain persist please return to urgent care to be reevaluated ?Use the topical numbing medication as prescribed.  Please wash in the nipple area thoroughly before breast-feeding so that the medication does not get into the your child's mouth ? ?

## 2021-09-24 ENCOUNTER — Telehealth: Payer: Medicaid Other | Admitting: Physician Assistant

## 2021-09-24 DIAGNOSIS — B3789 Other sites of candidiasis: Secondary | ICD-10-CM | POA: Diagnosis not present

## 2021-09-24 DIAGNOSIS — O9102 Infection of nipple associated with the puerperium: Secondary | ICD-10-CM

## 2021-09-24 MED ORDER — NYSTATIN 100000 UNIT/GM EX CREA: TOPICAL_CREAM | CUTANEOUS | 0 refills | Status: DC

## 2021-09-24 NOTE — Patient Instructions (Addendum)
?  Candace Lowe, thank you for joining Piedad Climes, PA-C for today's virtual visit.  While this provider is not your primary care provider (PCP), if your PCP is located in our provider database this encounter information will be shared with them immediately following your visit. ? ?Consent: ?(Patient) Candace Lowe provided verbal consent for this virtual visit at the beginning of the encounter. ? ?Current Medications: ? ?Current Outpatient Medications:  ?  nystatin cream (MYCOSTATIN), Apply thin layer to the nipple after breastfeeds. Continue for up to 10 more days until symptoms are fully resolved. Make sure to follow-up with pediatrician to ensure resolution of your baby's symptoms too., Disp: 30 g, Rfl: 0 ?  nystatin (MYCOSTATIN) 100000 UNIT/ML suspension, Take 5 mLs (500,000 Units total) by mouth 4 (four) times daily for 10 days., Disp: 60 mL, Rfl: 0  ? ?Medications ordered in this encounter:  ?Meds ordered this encounter  ?Medications  ? nystatin cream (MYCOSTATIN)  ?  Sig: Apply thin layer to the nipple after breastfeeds. Continue for up to 10 more days until symptoms are fully resolved. Make sure to follow-up with pediatrician to ensure resolution of your baby's symptoms too.  ?  Dispense:  30 g  ?  Refill:  0  ?  Order Specific Question:   Supervising Provider  ?  Answer:   Eber Hong [[id number]]  ?  ? ?*If you need refills on other medications prior to your next appointment, please contact your pharmacy* ? ?Follow-Up: ?Call back or seek an in-person evaluation if the symptoms worsen or if the condition fails to improve as anticipated. ? ?Other Instructions ?Please apply the nystatin cream to your nipples as directed -- this is to ensure no further yeast infection from your little one.  ?If they are still having issue with yeast, reach out to their pediatrician so they can get re-evaluated and give further/different medication as we cannot prescribe a medication for the little one through this  platform and without examining them.  ? ?If you have been instructed to have an in-person evaluation today at a local Urgent Care facility, please use the link below. It will take you to a list of all of our available [facility], including address, phone number and hours of operation. Please do not delay care.  ?[facility] ? ?If you or a family member do not have a primary care provider, use the link below to schedule a visit and establish care. When you choose a [facility] primary care physician or advanced practice provider, you gain a long-term partner in health. ?Find a Primary Care Provider ? ?Learn more about [facility] in-office and virtual care options: ?[facility] - Get Care Now  ?

## 2021-09-24 NOTE — Progress Notes (Signed)
?Virtual Visit Consent  ? ?Trena Platt, you are scheduled for a virtual visit with a [facility] provider today. Just as with appointments in the office, your consent must be obtained to participate. Your consent will be active for this visit and any virtual visit you may have with one of our providers in the next 365 days. If you have a MyChart account, a copy of this consent can be sent to you electronically. ? ?As this is a virtual visit, video technology does not allow for your provider to perform a traditional examination. This may limit your provider's ability to fully assess your condition. If your provider identifies any concerns that need to be evaluated in person or the need to arrange testing (such as labs, EKG, etc.), we will make arrangements to do so. Although advances in technology are sophisticated, we cannot ensure that it will always work on either your end or our end. If the connection with a video visit is poor, the visit may have to be switched to a telephone visit. With either a video or telephone visit, we are not always able to ensure that we have a secure connection. ? ?By engaging in this virtual visit, you consent to the provision of healthcare and authorize for your insurance to be billed (if applicable) for the services provided during this visit. Depending on your insurance coverage, you may receive a charge related to this service. ? ?I need to obtain your verbal consent now. Are you willing to proceed with your visit today? EULOGIA DISMORE has provided verbal consent on 09/24/2021 for a virtual visit (video or telephone). Piedad Climes, PA-C ? ?Date: 09/24/2021 1:13 PM ? ?Virtual Visit via Video Note  ? ?IPiedad Climes, connected with  Candace Lowe  ([id number], [birth year]) on 09/24/21 at  1:00 PM EDT by a video-enabled telemedicine application and verified that I am speaking with the correct person using two identifiers. ? ?Location: ?Patient: Virtual Visit  Location Patient: Home ?Provider: Virtual Visit Location Provider: Home Office ?  ?I discussed the limitations of evaluation and management by telemedicine and the availability of in person appointments. The patient expressed understanding and agreed to proceed.   ? ?History of Present Illness: ?Candace Lowe is a 31 y.o. who identifies as a female who was assigned female at birth, and is being seen today for request of medication refill. Patient recently seen and diagnosed with yeast mastitis at local Urgent Care. She notes she was given Nystatin to treat the infection and was told that she should apply for 3 days after full resolution of symptoms. Notes symptoms are mostly resolved but she ran out of medication. On review, seems she was given a nystatin suspension but told to give to her child. She notes she was not given anything for her to directly apply to herself. ? ?HPI: HPI  ?Problems:  ?Patient Active Problem List  ? Diagnosis Date Noted  ? Leakage of amniotic fluid 07/14/2020  ? Mild preeclampsia, third trimester 07/14/2020  ? Vaginal delivery 07/14/2020  ? [redacted] weeks gestation of pregnancy 07/11/2020  ? GBS (group B Streptococcus carrier), +RV culture, currently pregnant 07/07/2020  ? Gestational diabetes 05/16/2020  ? Low-lying placenta in second trimester 03/01/2020  ? Supervision of other normal pregnancy, antepartum 01/05/2020  ? Low grade squamous intraepithelial lesion on cytologic smear of cervix (LGSIL) 07/07/2016  ?  ?Allergies:  ?Allergies  ?Allergen Reactions  ? Mushroom Extract Complex Other (See Comments)  ?  "  throat hurts" ?  ? ?Medications:  ?Current Outpatient Medications:  ?  nystatin cream (MYCOSTATIN), Apply thin layer to the nipple after breastfeeds. Continue for up to 10 more days until symptoms are fully resolved. Make sure to follow-up with pediatrician to ensure resolution of your baby's symptoms too., Disp: 30 g, Rfl: 0 ?  nystatin (MYCOSTATIN) 100000 UNIT/ML suspension, Take 5  mLs (500,000 Units total) by mouth 4 (four) times daily for 10 days., Disp: 60 mL, Rfl: 0 ? ?Observations/Objective: ?Patient is well-developed, well-nourished in no acute distress.  ?Resting comfortably at home.  ?Head is normocephalic, atraumatic.  ?No labored breathing. ?Speech is clear and coherent with logical content.  ?Patient is alert and oriented at baseline.  ? ?Assessment and Plan: ?1. Yeast infection of nipple, postpartum ?- nystatin cream (MYCOSTATIN); Apply thin layer to the nipple after breastfeeds. Continue for up to 10 more days until symptoms are fully resolved. Make sure to follow-up with pediatrician to ensure resolution of your baby's symptoms too.  Dispense: 30 g; Refill: 0 ? ?Will give nystatin to apply as directed giving her little ones recent thrush and her current symptoms. This way we can ensure resolution of any yeast infection of the areolas. She is to contact child's pediatrician for follow-up regarding their thrush to ensure resolution of this. Seems at her last UC visit she was given nystatin suspension but to give to the child. Cannot provide that via this avenue without examining the infant and we are not allowed to see infants through this platform currently.  ? ?Follow Up Instructions: ?I discussed the assessment and treatment plan with the patient. The patient was provided an opportunity to ask questions and all were answered. The patient agreed with the plan and demonstrated an understanding of the instructions.  A copy of instructions were sent to the patient via MyChart unless otherwise noted below.  ? ?The patient was advised to call back or seek an in-person evaluation if the symptoms worsen or if the condition fails to improve as anticipated. ? ?Time:  ?I spent 10 minutes with the patient via telehealth technology discussing the above problems/concerns.   ? ?Piedad Climes, PA-C ?

## 2021-09-25 ENCOUNTER — Telehealth: Payer: Medicaid Other | Admitting: Family Medicine

## 2021-09-25 DIAGNOSIS — B3789 Other sites of candidiasis: Secondary | ICD-10-CM

## 2021-09-25 NOTE — Progress Notes (Signed)
[name]   Needs to call peds for newborn regarding thrush  Patient acknowledged agreement and understanding of the plan.

## 2021-09-25 NOTE — Patient Instructions (Signed)
Call peds office to get help with refill of medication for your newborn.   Take care, we hope you feel better soon.

## 2021-10-28 IMAGING — US US MFM OB FOLLOW-UP
1 series · 14 of 28 positions shown · non-contrast
Comparison: none

[Series 1: us mfm ob follow-up · 56 acquisitions, 14 frames shown]
[im 3/56]
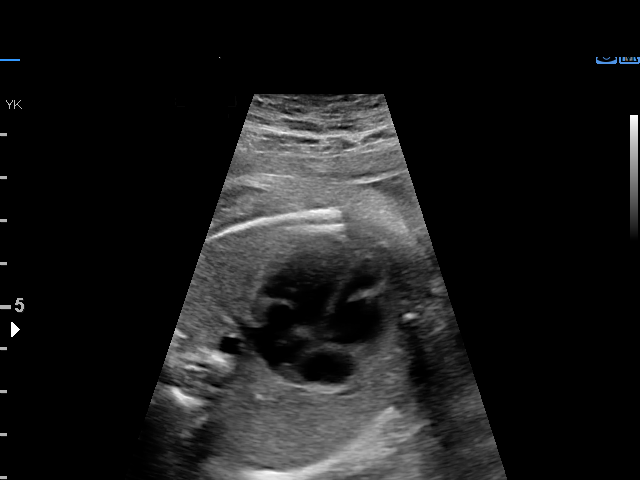
[im 7/56]
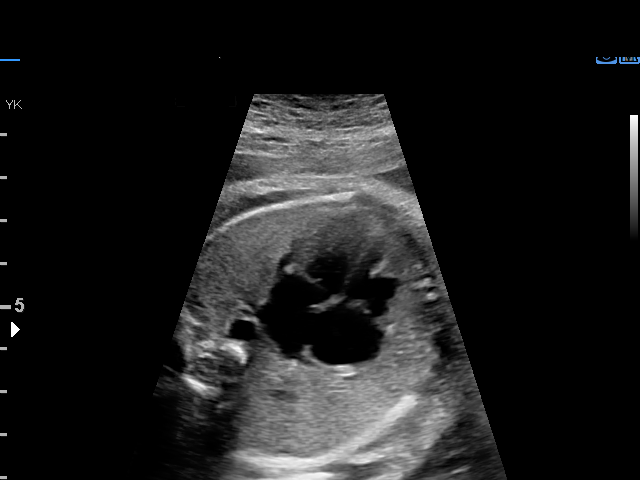
[im 11/56]
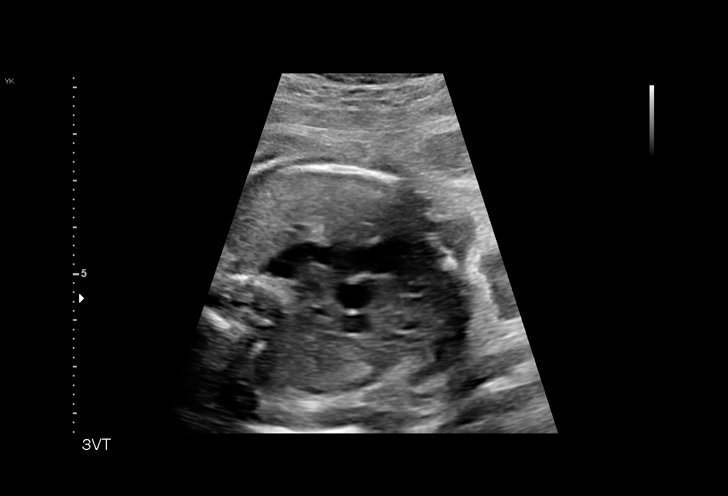
[im 15/56]
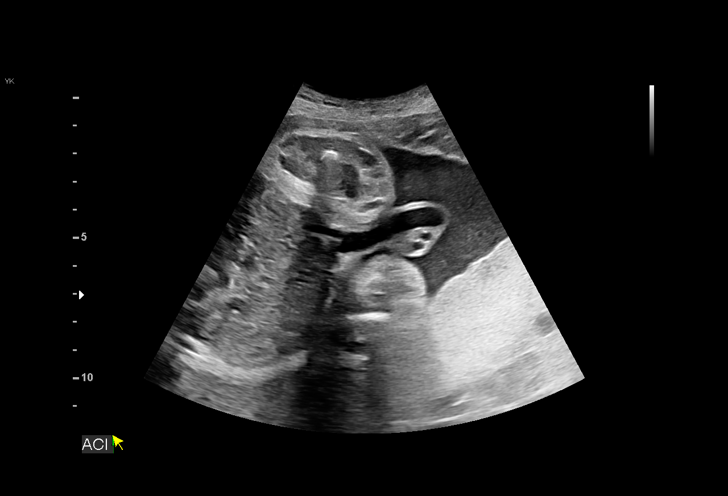
[im 19/56]
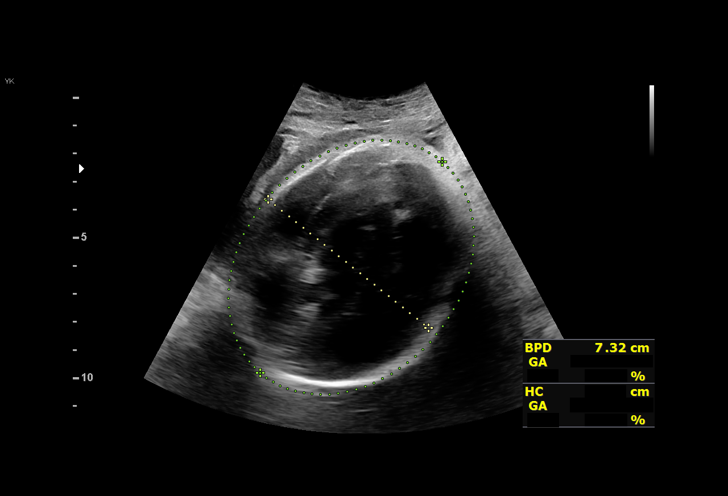
[im 23/56]
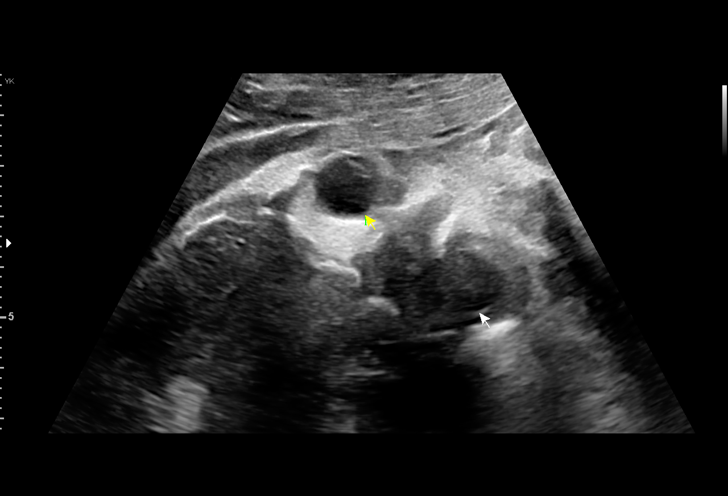
[im 27/56]
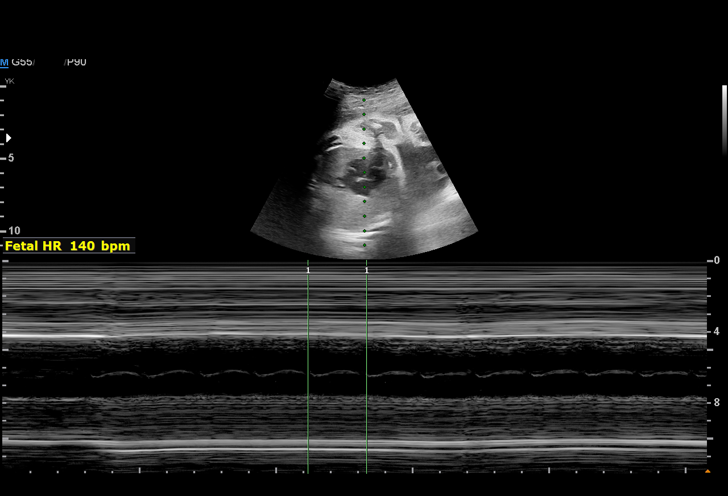
[im 31/56]
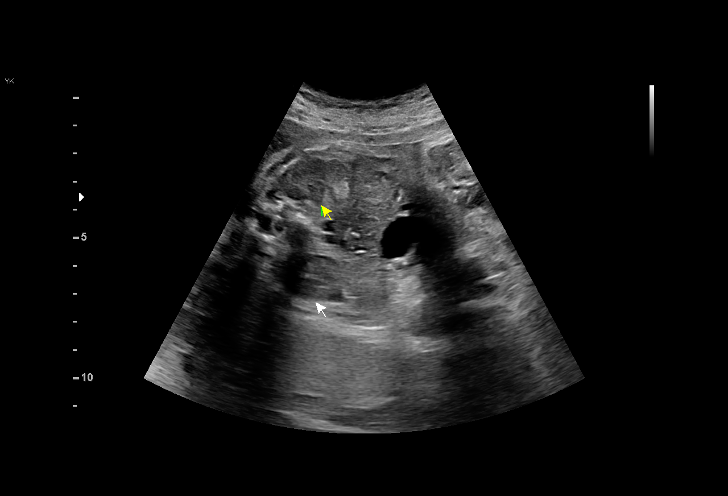
[im 35/56]
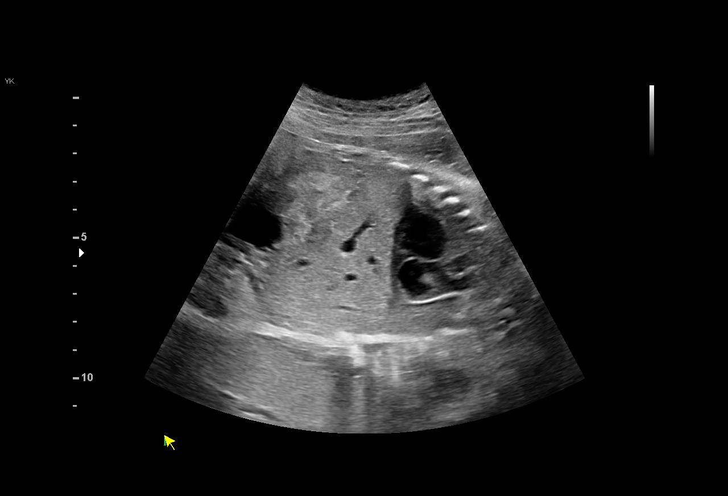
[im 39/56]
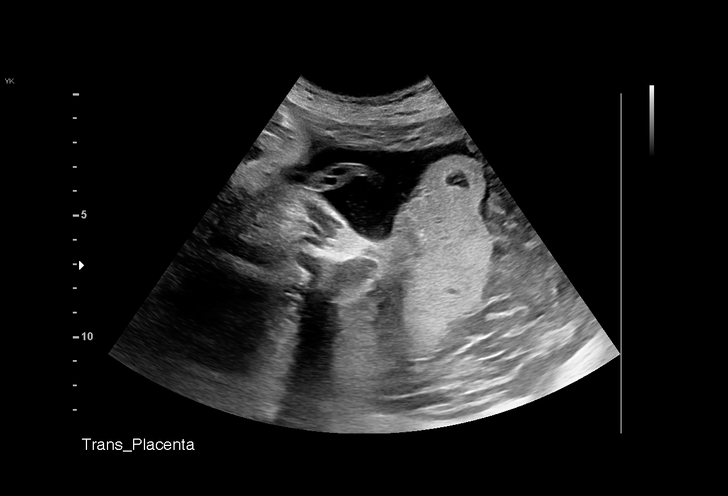
[im 43/56]
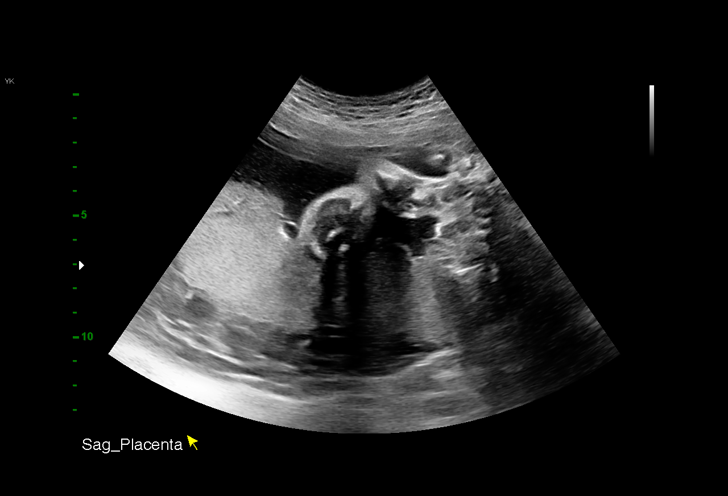
[im 47/56]
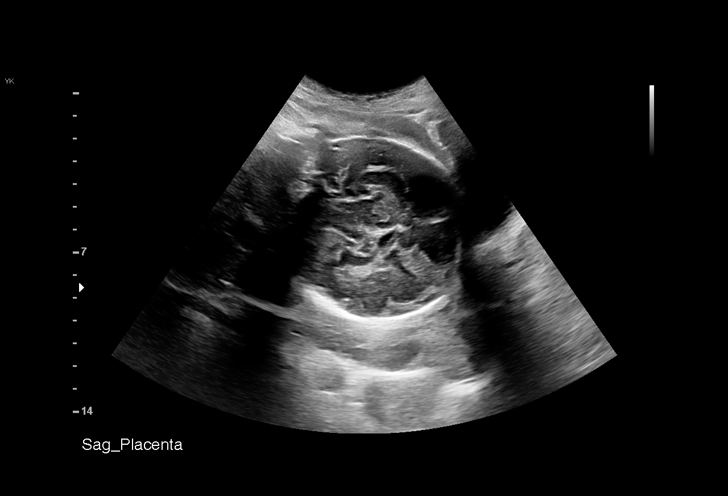
[im 51/56]
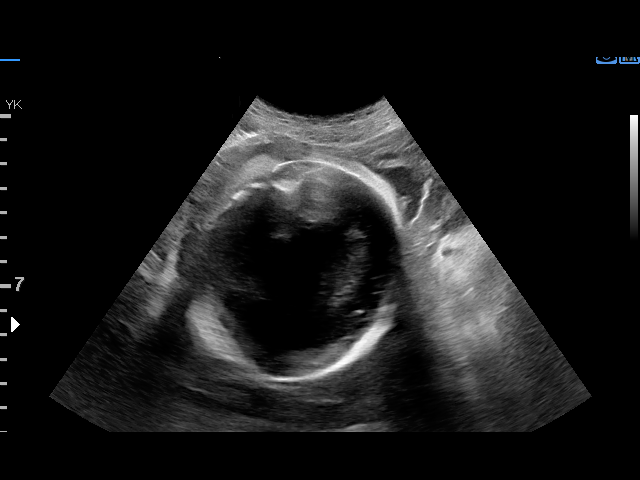
[im 56/56]
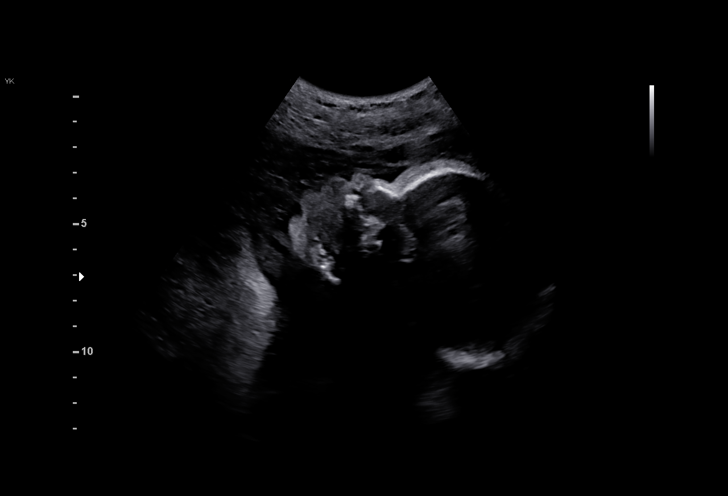

[14 of 28 positions shown; findings below may reference images not displayed]

Indications

 29 weeks gestation of pregnancy
 Low lying placenta, antepartum
 Antenatal follow-up for nonvisualized fetal
 anatomy
 Low Risk NIPS, AFP:Nml
 Silent Dahaba Sage
Fetal Evaluation

 Num Of Fetuses:         1
 Fetal Heart Rate(bpm):  136
 Cardiac Activity:       Observed
 Presentation:           Cephalic
 Placenta:               Posterior
 P. Cord Insertion:      Previously Visualized

 Amniotic Fluid
 AFI FV:      Within normal limits

 AFI Sum(cm)     %Tile       Largest Pocket(cm)
 11.75           27

 RUQ(cm)       RLQ(cm)       LUQ(cm)        LLQ(cm)
 2.41          3
Biometry
 BPD:      74.7  mm     G. Age:  30w 0d         41  %    CI:        72.34   %    70 - 86
                                                         FL/HC:      21.1   %    19.2 -
 HC:      279.4  mm     G. Age:  30w 4d         35  %    HC/AC:      1.06        0.99 -
 AC:      263.7  mm     G. Age:  30w 3d         64  %    FL/BPD:     78.8   %    71 - 87
 FL:       58.9  mm     G. Age:  30w 5d         61  %    FL/AC:      22.3   %    20 - 24

 Est. FW:    8934  gm      3 lb 8 oz     62  %
OB History

 Gravidity:    2         Term:   0        Prem:   0        SAB:   0
 TOP:          1       Ectopic:  0        Living: 0
Gestational Age

 LMP:           29w 6d        Date:  10/17/19                 EDD:   07/23/20
 U/S Today:     30w 3d                                        EDD:   07/19/20
 Best:          29w 6d     Det. By:  LMP  (10/17/19)          EDD:   07/23/20
Anatomy

 Cranium:               Appears normal         Aortic Arch:            Appears normal
 Cavum:                 Previously seen        Ductal Arch:            Appears normal
 Ventricles:            Appears normal         Diaphragm:              Appears normal
 Choroid Plexus:        Previously seen        Stomach:                Appears normal, left
                                                                       sided
 Cerebellum:            Previously seen        Abdomen:                Appears normal
 Posterior Fossa:       Previously seen        Abdominal Wall:         Previously seen
 Nuchal Fold:           Previously seen        Cord Vessels:           Appears normal (3
                                                                       vessel cord)
 Face:                  Appears normal         Kidneys:                Appear normal
                        (orbits and profile)
 Lips:                  Previously seen        Bladder:                Appears normal
 Thoracic:              Appears normal         Spine:                  Previously seen
 Heart:                 Appears normal         Upper Extremities:      Previously seen
                        (4CH, axis, and
                        situs)
 RVOT:                  Appears normal         Lower Extremities:      Previously seen
 LVOT:                  Appears normal

 Other:  Fetus appears to be female. Technically difficult due to fetal position.
Impression

 Patient returned for evaluation of placenta.  Low-lying
 placenta was detected on previous scans.  She does not give
 history of vaginal bleeding.

 Amniotic fluid is normal and good fetal activity is seen .Fetal
 growth is appropriate for gestational age .
 On transabdominal scan, centile is posterior and there is no
 evidence of low-lying placenta or previa.

 We reassured the patient of the finding
Recommendations

 Follow-up scans as clinically indicated.
                 Berg, [place]

## 2021-12-11 IMAGING — US US MFM OB FOLLOW-UP
1 series · 15 of 28 positions shown · non-contrast
Comparison: none

[Series 1: us mfm ob follow-up · 36 acquisitions, 15 frames shown]
[im 1/36]
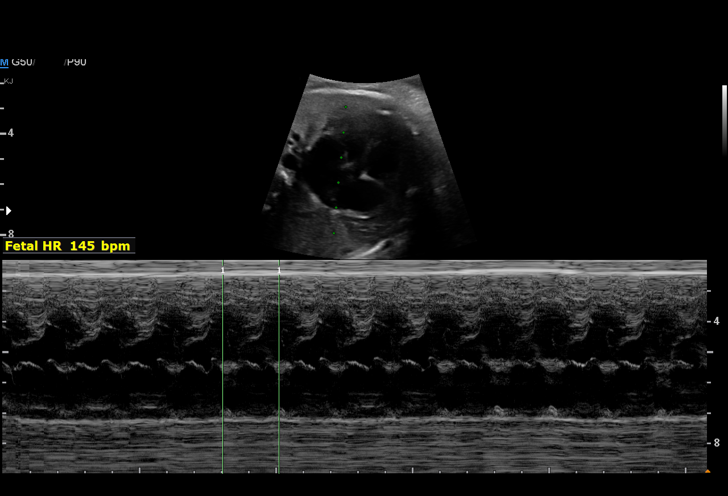
[im 3/36]
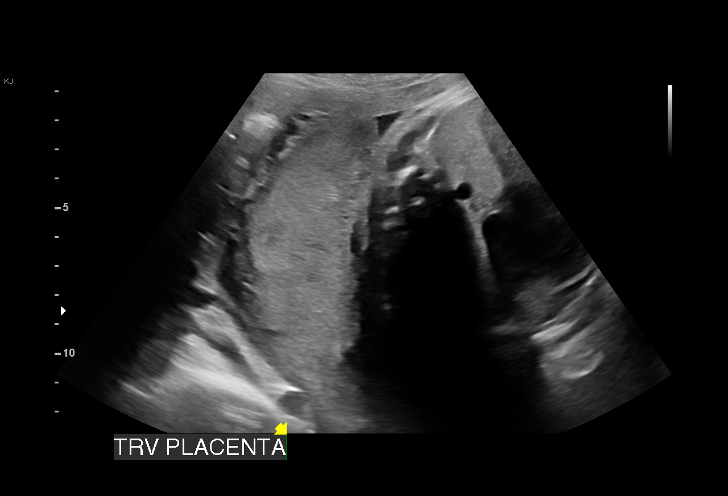
[im 6/36]
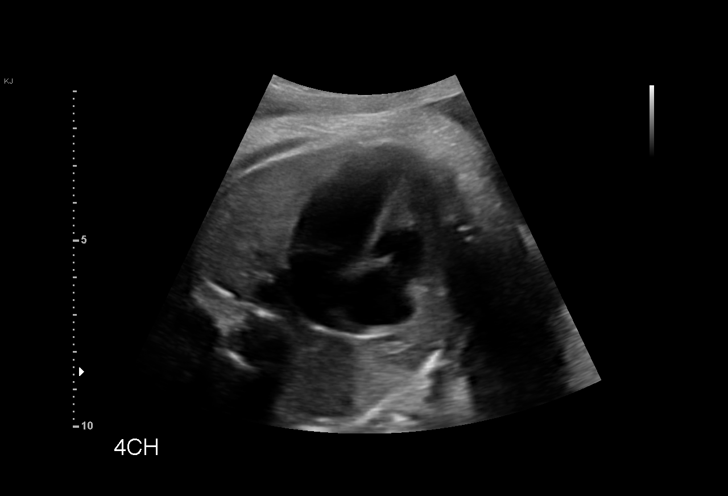
[im 8/36]
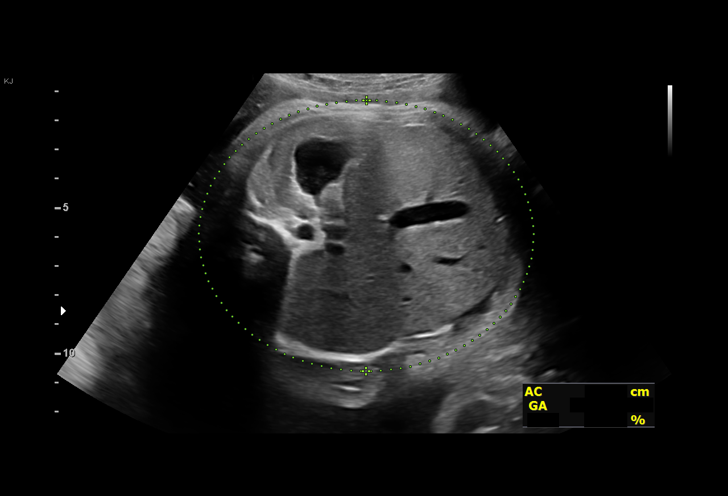
[im 11/36]
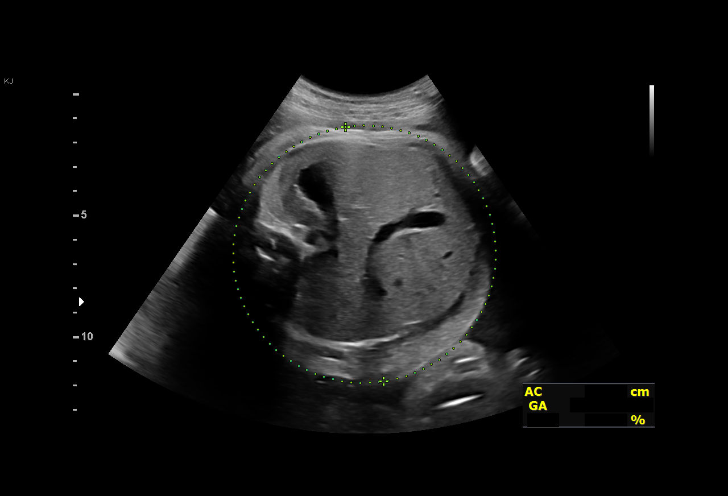
[im 13/36]
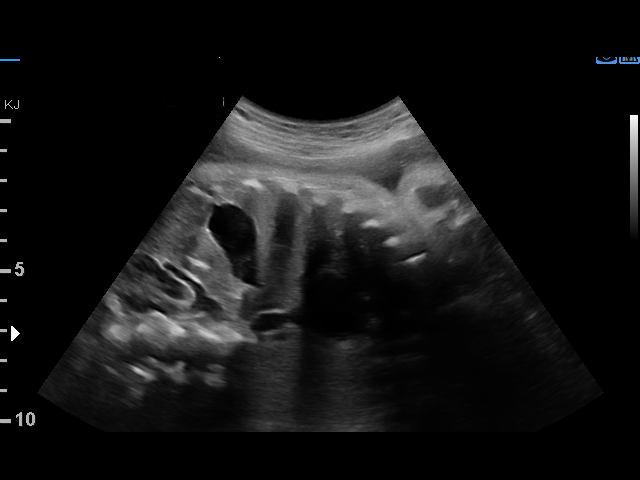
[im 16/36]
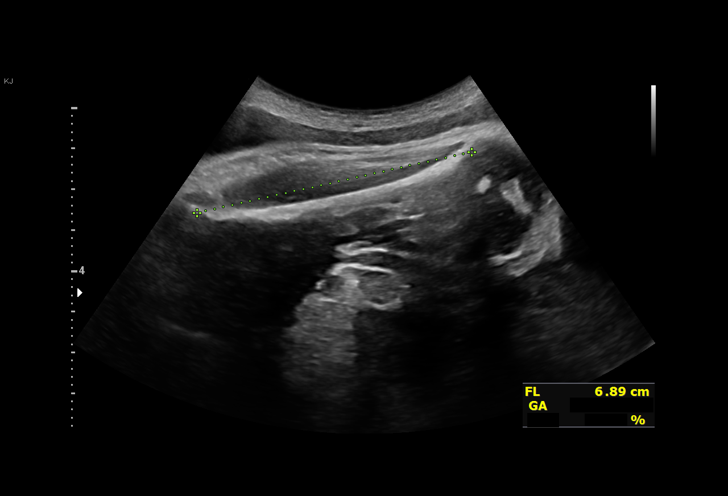
[im 19/36]
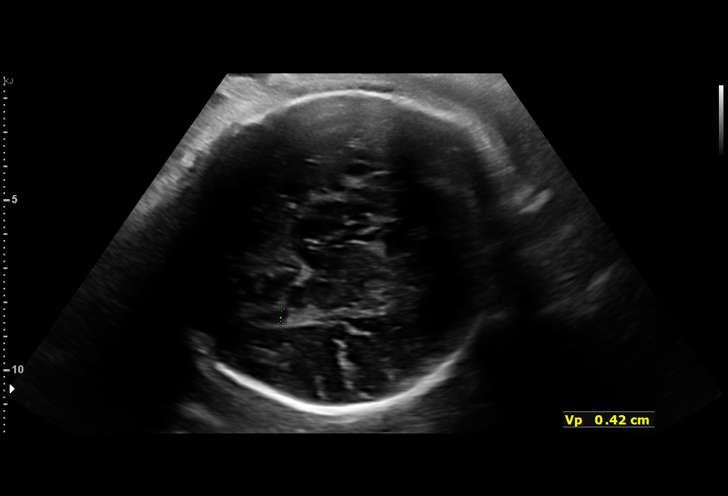
[im 20/36]
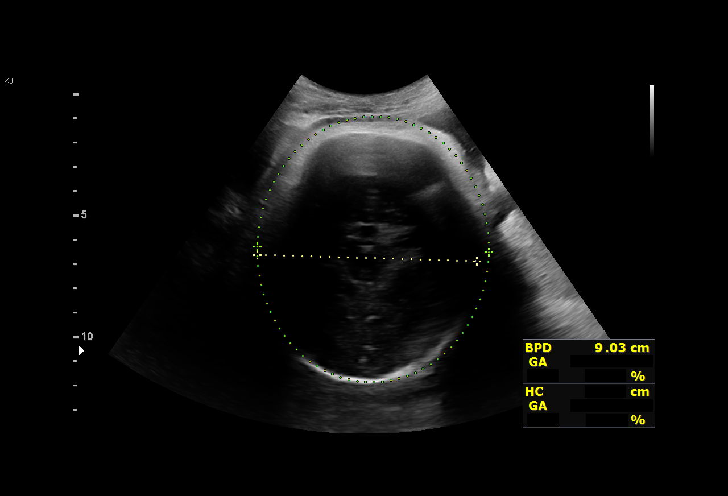
[im 23/36]
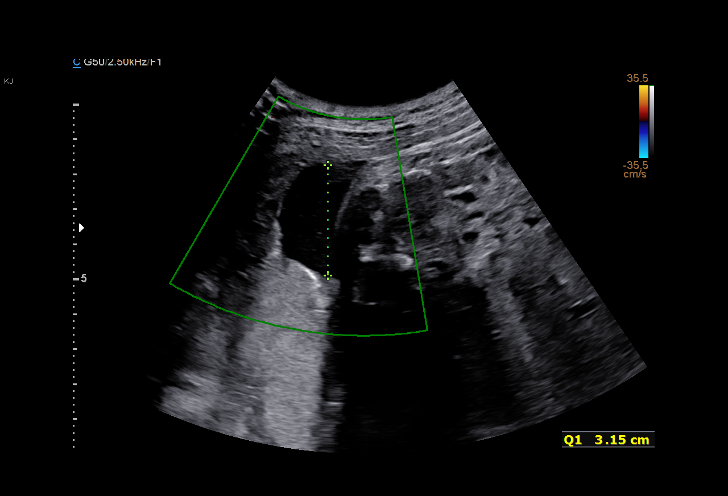
[im 25/36]
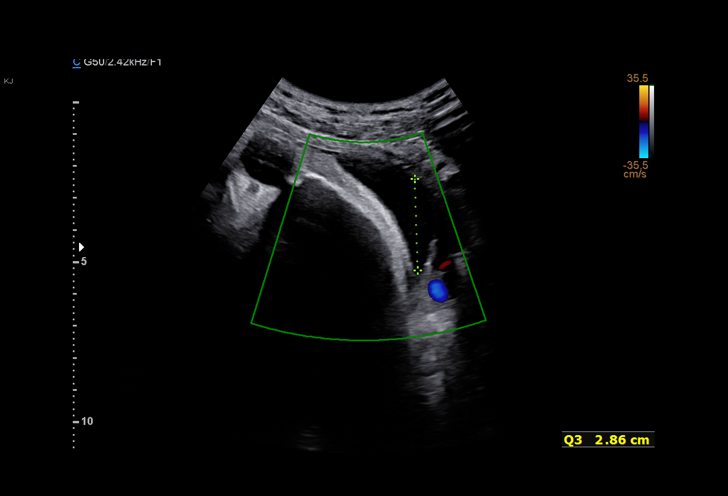
[im 28/36]
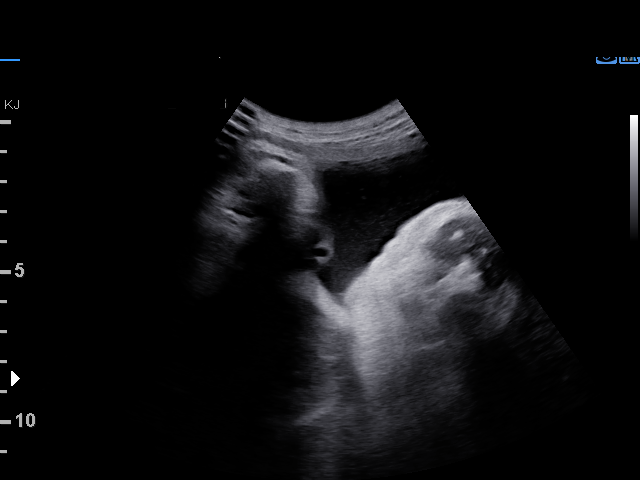
[im 30/36]
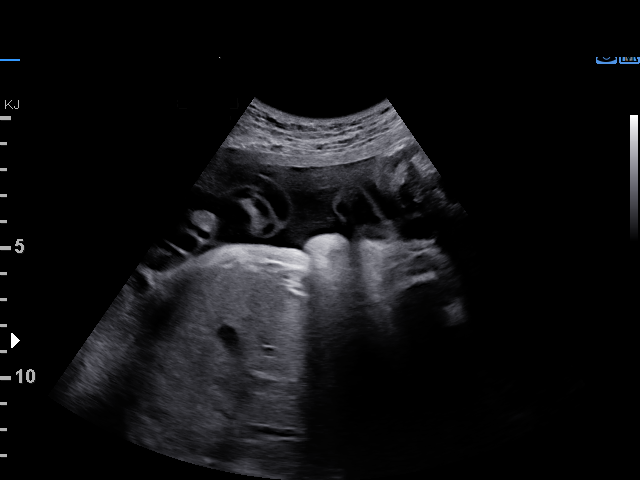
[im 33/36]
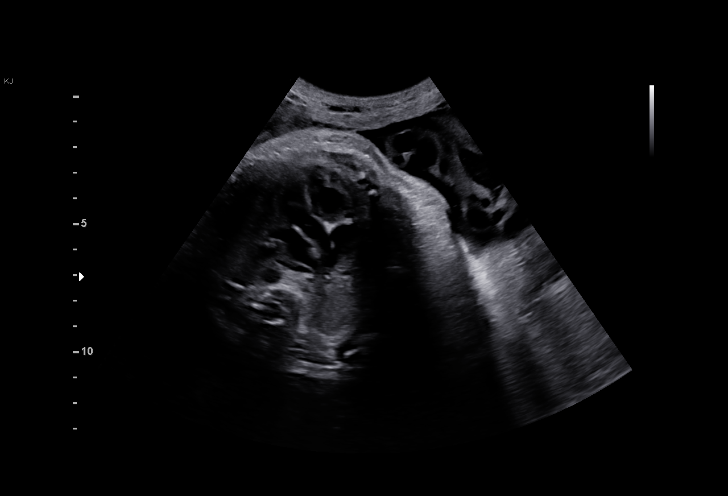
[im 36/36]
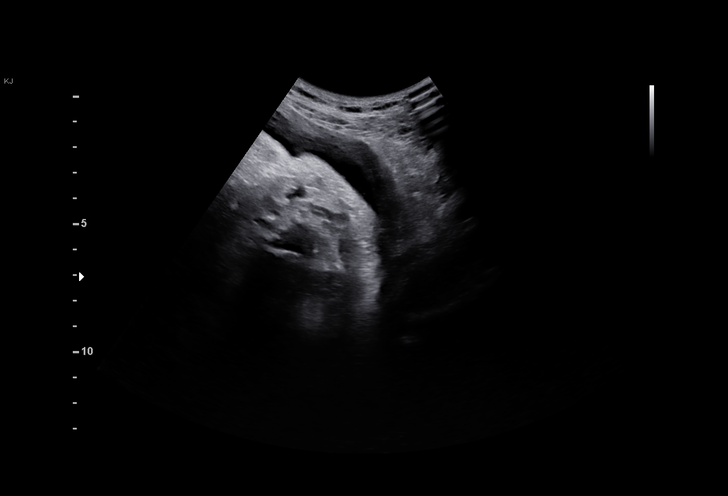

[15 of 28 positions shown; findings below may reference images not displayed]

Indications

 Low lying placenta, antepartum  (resolved
 [DATE])
 Antenatal follow-up for nonvisualized fetal
 anatomy
 Low Risk NIPS, AFP:Nml
 Silent Jayesh Fraga
 36 weeks gestation of pregnancy
Fetal Evaluation

 Num Of Fetuses:         1
 Fetal Heart Rate(bpm):  145
 Cardiac Activity:       Observed
 Presentation:           Cephalic
 Placenta:               Posterior
 P. Cord Insertion:      Previously Visualized

 Amniotic Fluid
 AFI FV:      Within normal limits

 AFI Sum(cm)     %Tile       Largest Pocket(cm)
 11.84           36

 RUQ(cm)       RLQ(cm)       LUQ(cm)        LLQ(cm)

Biometry
 BPD:      88.9  mm     G. Age:  36w 0d         54  %    CI:        76.05   %    70 - 86
                                                         FL/HC:      21.4   %    20.1 -
 HC:      323.1  mm     G. Age:  36w 4d         29  %    HC/AC:      0.96        0.93 -
 AC:      335.7  mm     G. Age:  37w 3d         90  %    FL/BPD:     77.6   %    71 - 87
 FL:         69  mm     G. Age:  35w 3d         27  %    FL/AC:      20.6   %    20 - 24
 LV:        4.2  mm

 Est. FW:    8668  gm    6 lb 10 oz      67  %
OB History

 Gravidity:    2         Term:   0        Prem:   0        SAB:   0
 TOP:          1       Ectopic:  0        Living: 0
Gestational Age

 LMP:           36w 1d        Date:  10/17/19                 EDD:   07/23/20
 U/S Today:     36w 3d                                        EDD:   07/21/20
 Best:          36w 1d     Det. By:  LMP  (10/17/19)          EDD:   07/23/20
Anatomy

 Ventricles:            Appears normal         Stomach:                Appears normal, left
                                                                       sided
 Heart:                 Appears normal         Kidneys:                Appear normal
                        (4CH, axis, and
                        situs)
 Diaphragm:             Appears normal         Bladder:                Appears normal
Comments

 This patient was seen for a follow up growth scan due to
 recently diagnosed diet-controlled gestational diabetes.  She
 denies any other problems since her last exam.
 She was informed that the fetal growth and amniotic fluid
 level appears appropriate for her gestational age.
 As the fetal growth is within normal limits, no further exams
 were scheduled in our office.

## 2022-08-11 ENCOUNTER — Ambulatory Visit: Payer: Medicaid Other

## 2022-08-11 ENCOUNTER — Encounter (HOSPITAL_COMMUNITY): Payer: Self-pay

## 2022-08-11 ENCOUNTER — Ambulatory Visit (HOSPITAL_COMMUNITY)
Admission: EM | Admit: 2022-08-11 | Discharge: 2022-08-11 | Disposition: A | Discharge status: Home / Self Care | Payer: Medicaid Other | Attending: Physician Assistant | Admitting: Physician Assistant

## 2022-08-11 DIAGNOSIS — N751 Abscess of Bartholin's gland: Secondary | ICD-10-CM | POA: Diagnosis not present

## 2022-08-11 MED ORDER — LIDOCAINE-EPINEPHRINE 1 %-1:100000 IJ SOLN
INTRAMUSCULAR | Status: AC
  Filled 2022-08-11: qty 1

## 2022-08-11 MED ORDER — SULFAMETHOXAZOLE-TRIMETHOPRIM 800-160 MG PO TABS: 1.0000 | ORAL_TABLET | Freq: Two times a day (BID) | ORAL | 0 refills | Status: DC

## 2022-08-11 MED ORDER — IBUPROFEN 600 MG PO TABS: 600.0000 mg | ORAL_TABLET | Freq: Three times a day (TID) | ORAL | 0 refills | Status: AC | PRN

## 2022-08-11 MED ORDER — HYDROCODONE-ACETAMINOPHEN 5-325 MG PO TABS: 1.0000 | ORAL_TABLET | Freq: Every evening | ORAL | 0 refills | Status: AC | PRN

## 2022-08-11 NOTE — ED Triage Notes (Signed)
Patient reports an abscess on her labia x 5 days.

## 2022-08-11 NOTE — ED Provider Notes (Signed)
[place]    CSN: [id number] Arrival date & time: 08/11/22  1425      History   Chief Complaint Chief Complaint  Patient presents with   Abscess    Entered by patient    HPI Candace Lowe is a 32 y.o. female.   Patient presents today with a 5-day history of enlarging and painful lesion on her labia.  She denies any fever but has had hot flashes.  Denies any nausea, vomiting, diarrhea.  Denies episodes of similar symptoms in the past.  She reports that pain is unbearable and she has been taking a lot of Tylenol without improvement.  Pain is currently rated 9 on 0-10 pain scale, described as sharp, worse with sitting straight up or palpation, no relieving factors identified.  She denies history of similar symptoms.  Denies any recent antibiotics.  She has also tried Epsom salt baths regularly without improvement of symptoms.  She denies history of diabetes or immunosuppression.    Past Medical History:  Diagnosis Date   Gestational diabetes    Mental disorder    Seasonal depression    Vaginal delivery 07/14/2020    Patient Active Problem List   Diagnosis Date Noted   Leakage of amniotic fluid 07/14/2020   Mild preeclampsia, third trimester 07/14/2020   Vaginal delivery 07/14/2020   [redacted] weeks gestation of pregnancy 07/11/2020   GBS (group B Streptococcus carrier), +RV culture, currently pregnant 07/07/2020   Gestational diabetes 05/16/2020   Low-lying placenta in second trimester 03/01/2020   Supervision of other normal pregnancy, antepartum 01/05/2020   Low grade squamous intraepithelial lesion on cytologic smear of cervix (LGSIL) 07/07/2016    Past Surgical History:  Procedure Laterality Date   WISDOM TOOTH EXTRACTION  2017    OB History     Gravida  2   Para  1   Term  1   Preterm  0   AB  1   Living  1      SAB  0   IAB  1   Ectopic  0   Multiple  0   Live Births  1            Home Medications    Prior to Admission  medications   Medication Sig Start Date End Date Taking? Authorizing Provider  HYDROcodone-acetaminophen (NORCO/VICODIN) 5-325 MG tablet Take 1 tablet by mouth at bedtime as needed for up to 2 days. 08/11/22 08/13/22 Yes Nakai Pollio K, PA-C  ibuprofen (ADVIL) 600 MG tablet Take 1 tablet (600 mg total) by mouth every 8 (eight) hours as needed. 08/11/22  Yes Orlie Cundari K, PA-C  sulfamethoxazole-trimethoprim (BACTRIM DS) 800-160 MG tablet Take 1 tablet by mouth 2 (two) times daily for 7 days. 08/11/22 08/18/22 Yes Trinadee Verhagen, Derry Skill, PA-C    Family History Family History  Problem Relation Age of Onset   Diabetes Father    Diabetes Paternal Grandmother    Diabetes Paternal Grandfather     Social History Social History   Tobacco Use   Smoking status: Never   Smokeless tobacco: Never  Vaping Use   Vaping Use: Never used  Substance Use Topics   Alcohol use: No   Drug use: Not Currently    Types: Marijuana    Comment: last time was days ago     Allergies   Mushroom extract complex   Review of Systems Review of Systems  Constitutional:  Positive for activity change. Negative for appetite change, fatigue and  fever.  Respiratory:  Negative for cough and shortness of breath.   Cardiovascular:  Negative for chest pain.  Gastrointestinal:  Negative for abdominal pain, diarrhea, nausea and vomiting.  Genitourinary:  Positive for vaginal pain. Negative for dysuria, frequency, pelvic pain, urgency, vaginal bleeding and vaginal discharge.     Physical Exam Triage Vital Signs ED Triage Vitals  Enc Vitals Group     BP 08/11/22 1624 (!) 108/6     Pulse Rate 08/11/22 1624 86     Resp 08/11/22 1624 14     Temp 08/11/22 1624 98.8 F (37.1 C)     Temp Source 08/11/22 1624 Oral     SpO2 08/11/22 1624 96 %     Weight --      Height --      Head Circumference --      Peak Flow --      Pain Score 08/11/22 1625 9     Pain Loc --      Pain Edu? --      Excl. in [place]? --    No data  found.  Updated Vital Signs BP 108/66 (BP Location: Right Arm)   Pulse 86   Temp 98.8 F (37.1 C) (Oral)   Resp 14   LMP 07/29/2022 (Exact Date)   SpO2 96%   Visual Acuity Right Eye Distance:   Left Eye Distance:   Bilateral Distance:    Right Eye Near:   Left Eye Near:    Bilateral Near:     Physical Exam Vitals reviewed.  Constitutional:      General: She is awake. She is not in acute distress.    Appearance: Normal appearance. She is well-developed. She is not ill-appearing.     Comments: Very pleasant female appears stated age in no acute distress sitting comfortably in exam room  HENT:     Head: Normocephalic and atraumatic.  Cardiovascular:     Rate and Rhythm: Normal rate and regular rhythm.     Heart sounds: Normal heart sounds, S1 normal and S2 normal. No murmur heard. Pulmonary:     Effort: Pulmonary effort is normal.     Breath sounds: Normal breath sounds. No wheezing, rhonchi or rales.     Comments: Clear to auscultation bilaterally Abdominal:     General: Bowel sounds are normal.     Palpations: Abdomen is soft.     Tenderness: There is no abdominal tenderness. There is no right CVA tenderness, left CVA tenderness, guarding or rebound.     Comments: Benign abdominal exam  Genitourinary:    Labia:        Right: Lesion present. No rash or tenderness.        Left: No rash, tenderness or lesion.      Comments: 3 cm x 2 cm lesion noted right labia minora with a central fluctuance. Psychiatric:        Behavior: Behavior is cooperative.      UC Treatments / Results  Labs (all labs ordered are listed, but only abnormal results are displayed) Labs Reviewed - No data to display  EKG   Radiology No results found.  Procedures Incision and Drainage  Date/Time: 08/11/2022 6:07 PM  Performed by: Terrilee Croak, PA-C Authorized by: Terrilee Croak, PA-C   Consent:    Consent obtained:  Verbal   Consent given by:  Patient   Risks discussed:  Bleeding,  incomplete drainage, infection and pain   Alternatives discussed:  Observation, referral and  alternative treatment Universal protocol:    Procedure explained and questions answered to patient or proxy's satisfaction: yes     Patient identity confirmed:  Verbally with patient Location:    Type:  Bartholin cyst   Size:  3 cm x 2 cm   Location:  Anogenital   Anogenital location:  Bartholin's gland Pre-procedure details:    Skin preparation:  Chlorhexidine with alcohol Sedation:    Sedation type:  None Anesthesia:    Anesthesia method:  Local infiltration   Local anesthetic:  Lidocaine 2% WITH epi Procedure type:    Complexity:  Complex Procedure details:    Ultrasound guidance: no     Needle aspiration: no     Incision types:  Single straight   Incision depth:  Dermal   Wound management:  Irrigated with saline and probed and deloculated   Drainage:  Purulent and bloody   Drainage amount:  Moderate   Wound treatment:  Wound left open   Packing materials:  None Post-procedure details:    Procedure completion:  Tolerated  (including critical care time)  Medications Ordered in UC Medications - No data to display  Initial Impression / Assessment and Plan / UC Course  I have reviewed the triage vital signs and the nursing notes.  Pertinent labs & imaging results that were available during my care of the patient were reviewed by me and considered in my medical decision making (see chart for details).     Patient is well-appearing, afebrile, nontoxic, nontachycardic.  I&D was performed in clinic today.  See procedure note above.  We discussed that ideally a Word catheter should be placed but we do not have these available.  Recommend she follow-up with OB/GYN soon as possible.  She was started on Bactrim DS twice daily for 7 days.  She is currently breast-feeding her 54-year-old we discussed that some of this medication is passed through breastmilk so she should monitor baby for  irritability, diarrhea, rash.  If she or baby start to have any side effects to stop the medication to be seen immediately.  She was prescribed ibuprofen for pain relief with instructions to take additional NSAIDs.  Given severity of pain due to pain, 2 doses of hydrocodone were sent to pharmacy we discussed that she should pump and dump for 12 hours after each dose as some of this is passed through breastmilk.  We discussed that it is sedating and she should not drive or drink alcohol taking it.  Review of [place] controlled substance database shows no appropriate refills.  She is to follow-up with OB/GYN for further evaluation and management and was given contact information for local provider.  Discussed that if she has any worsening or changing symptoms including enlarging, painful lesion, fever, nausea, vomiting, weakness that she needs to be seen immediately.  Strict return precautions given.  Work excuse note provided.  Final Clinical Impressions(s) / UC Diagnoses   Final diagnoses:  Bartholin's gland abscess     Discharge Instructions      We have drained the abscess.  This may continue to drain as he might notice some blood and pus.  Continue using warm compresses to encourage drainage.  I have called in ibuprofen for pain relief.  Use this every 8 hours as needed.  You should not take additional NSAIDs over-the-counter including aspirin, ibuprofen/Advil, naproxen/Aleve.  Start Bactrim DS twice daily for 7 days.  Some of this can be passed through breastmilk so monitor your toddler for diarrhea,  rash.  If she develops any symptoms please stop the medication and have her seen.  I have called in 2 doses of hydrocodone to be used at night given the severity of pain.  As we discussed, some of this can be passed through breastmilk and I recommend that you pump and dump for 12 hours after this dose.  It is also sedating so do not drive or drink alcohol with taking it.  Follow-up with your  OB/GYN as soon as possible.  If anything worsens and you have enlarging or more painful lesion, fever, nausea, vomiting you need to be seen immediately.     ED Prescriptions     Medication Sig Dispense Auth. Provider   ibuprofen (ADVIL) 600 MG tablet Take 1 tablet (600 mg total) by mouth every 8 (eight) hours as needed. 30 tablet Jodean Valade K, PA-C   sulfamethoxazole-trimethoprim (BACTRIM DS) 800-160 MG tablet Take 1 tablet by mouth 2 (two) times daily for 7 days. 14 tablet Naz Denunzio K, PA-C   HYDROcodone-acetaminophen (NORCO/VICODIN) 5-325 MG tablet Take 1 tablet by mouth at bedtime as needed for up to 2 days. 2 tablet Damaree Sargent K, PA-C      I have reviewed the PDMP during this encounter.   Terrilee Croak, PA-C 08/11/22 1818

## 2022-08-11 NOTE — Discharge Instructions (Signed)
We have drained the abscess.  This may continue to drain as he might notice some blood and pus.  Continue using warm compresses to encourage drainage.  I have called in ibuprofen for pain relief.  Use this every 8 hours as needed.  You should not take additional NSAIDs over-the-counter including aspirin, ibuprofen/Advil, naproxen/Aleve.  Start Bactrim DS twice daily for 7 days.  Some of this can be passed through breastmilk so monitor your toddler for diarrhea, rash.  If she develops any symptoms please stop the medication and have her seen.  I have called in 2 doses of hydrocodone to be used at night given the severity of pain.  As we discussed, some of this can be passed through breastmilk and I recommend that you pump and dump for 12 hours after this dose.  It is also sedating so do not drive or drink alcohol with taking it.  Follow-up with your OB/GYN as soon as possible.  If anything worsens and you have enlarging or more painful lesion, fever, nausea, vomiting you need to be seen immediately.

## 2022-08-15 ENCOUNTER — Telehealth: Payer: Medicaid Other | Admitting: Nurse Practitioner

## 2022-08-15 ENCOUNTER — Emergency Department (HOSPITAL_COMMUNITY)
Admission: EM | Admit: 2022-08-15 | Discharge: 2022-08-15 | Disposition: A | Discharge status: Home / Self Care | Payer: Medicaid Other | Attending: Emergency Medicine | Admitting: Emergency Medicine

## 2022-08-15 ENCOUNTER — Encounter (HOSPITAL_COMMUNITY): Payer: Self-pay

## 2022-08-15 DIAGNOSIS — T7840XA Allergy, unspecified, initial encounter: Secondary | ICD-10-CM

## 2022-08-15 DIAGNOSIS — R21 Rash and other nonspecific skin eruption: Secondary | ICD-10-CM | POA: Diagnosis not present

## 2022-08-15 DIAGNOSIS — R220 Localized swelling, mass and lump, head: Secondary | ICD-10-CM | POA: Diagnosis not present

## 2022-08-15 LAB — BASIC METABOLIC PANEL
Anion gap: 11 (ref 5–15)
BUN: 10 mg/dL (ref 6–20)
CO2: 22 mmol/L (ref 22–32)
Calcium: 8.1 mg/dL — ABNORMAL LOW (ref 8.9–10.3)
Chloride: 104 mmol/L (ref 98–111)
Creatinine, Ser: 0.82 mg/dL (ref 0.44–1.00)
GFR, Estimated: >60 mL/min (ref >60)
Glucose, Bld: 112 mg/dL — ABNORMAL HIGH (ref 70–99)
Potassium: 3.1 mmol/L — ABNORMAL LOW (ref 3.5–5.1)
Sodium: 137 mmol/L (ref 135–145)

## 2022-08-15 LAB — CBC WITH DIFFERENTIAL/PLATELET
Abs Immature Granulocytes: 0.06 10*3/uL (ref 0.00–0.07)
Basophils Absolute: 0.1 10*3/uL (ref 0.0–0.1)
Basophils Relative: 1 %
Eosinophils Absolute: 0.3 10*3/uL (ref 0.0–0.5)
Eosinophils Relative: 3 %
HCT: 37.7 % (ref 36.0–46.0)
Hemoglobin: 12 g/dL (ref 12.0–15.0)
Immature Granulocytes: 1 %
Lymphocytes Relative: 29 %
Lymphs Abs: 2.9 10*3/uL (ref 0.7–4.0)
MCH: 26.8 pg (ref 26.0–34.0)
MCHC: 31.8 g/dL (ref 30.0–36.0)
MCV: 84.2 fL (ref 80.0–100.0)
Monocytes Absolute: 1.7 10*3/uL — ABNORMAL HIGH (ref 0.1–1.0)
Monocytes Relative: 17 %
Neutro Abs: 5.2 10*3/uL (ref 1.7–7.7)
Neutrophils Relative %: 49 %
Platelets: 256 10*3/uL (ref 150–400)
RBC: 4.48 MIL/uL (ref 3.87–5.11)
RDW: 11.5 % (ref 11.5–15.5)
WBC: 10.3 10*3/uL (ref 4.0–10.5)
nRBC: 0 % (ref 0.0–0.2)

## 2022-08-15 LAB — I-STAT BETA HCG BLOOD, ED (MC, WL, AP ONLY): I-stat hCG, quantitative: <5 m[IU]/mL (ref <5)

## 2022-08-15 MED ORDER — FAMOTIDINE 20 MG PO TABS: 20.0000 mg | ORAL_TABLET | Freq: Two times a day (BID) | ORAL | 0 refills | Status: AC

## 2022-08-15 MED ORDER — PREDNISONE 50 MG PO TABS: ORAL_TABLET | ORAL | 0 refills | Status: AC

## 2022-08-15 MED ORDER — FAMOTIDINE IN NACL 20-0.9 MG/50ML-% IV SOLN
20.0000 mg | Freq: Once | INTRAVENOUS | Status: AC
  Administered 2022-08-15: 20 mg via INTRAVENOUS
  Filled 2022-08-15: qty 50

## 2022-08-15 MED ORDER — DIPHENHYDRAMINE HCL 50 MG/ML IJ SOLN: 25.0000 mg | Freq: Once | INTRAMUSCULAR | Status: DC

## 2022-08-15 MED ORDER — DOXYCYCLINE HYCLATE 100 MG PO CAPS: 100.0000 mg | ORAL_CAPSULE | Freq: Two times a day (BID) | ORAL | 0 refills | Status: AC

## 2022-08-15 MED ORDER — EPINEPHRINE 0.3 MG/0.3ML IJ SOAJ: 0.3000 mg | INTRAMUSCULAR | 0 refills | Status: AC | PRN

## 2022-08-15 MED ORDER — LACTATED RINGERS IV BOLUS
1000.0000 mL | Freq: Once | INTRAVENOUS | Status: AC
  Administered 2022-08-15: 1000 mL via INTRAVENOUS

## 2022-08-15 MED ORDER — METHYLPREDNISOLONE SODIUM SUCC 125 MG IJ SOLR
125.0000 mg | Freq: Once | INTRAMUSCULAR | Status: AC
  Administered 2022-08-15: 125 mg via INTRAVENOUS
  Filled 2022-08-15: qty 2

## 2022-08-15 NOTE — ED Notes (Addendum)
Patient tolerating PO fluids without vomiting.

## 2022-08-15 NOTE — Progress Notes (Signed)
No show Contacted patient b phone and she said she meant to cancel her appointment  Mary-Margaret Daphine Deutscher, FNP

## 2022-08-15 NOTE — ED Provider Notes (Signed)
[facility]Y DEPARTMENT AT [facility] Provider Note   CSN: [id number] Arrival date & time: 08/15/22  0122     History  Chief Complaint  Patient presents with   Allergic Reaction    Patient to ED via EMS with complaint of allergic reaction to a sulfa drug. Patient reports rash sib=nce starting Thursday. Patient reports waking up with swollen lips and sob. EMS reports giving IM epi 0.3 mg 1:1 and 50 MG benadryl IV    Candace Lowe is a 32 y.o. female.  Patient present with suspected allergic reaction to Bactrim.  States she woke up from sleep with a swollen face and swollen lips and difficulty breathing.  EMS was called and she was given IM epinephrine at 12:50 AM, 50 mg of Benadryl.  She started to feel improved.  Denies any chest pain or shortness of breath.  Did have a rash involving her arms and her legs and her trunk.  Still having some lip swelling and facial swelling.  Denies any other new exposures.  She took 3 or 4 doses of Bactrim for a Bartholin's abscess that was drained urgent care 2 days ago.  She is never had Bactrim before.  She is also been taking ibuprofen.  She was prescribed hydrocodone but did not take any of this.  Denies any other new medications, foods, cleaning products, clothing. No history of similar reaction. She has never had Bactrim before. Denies taking any hydrocodone.  Has had ibuprofen before without difficulty.  The history is provided by the patient.  Allergic Reaction Presenting symptoms: rash        Home Medications Prior to Admission medications   Medication Sig Start Date End Date Taking? Authorizing Provider  ibuprofen (ADVIL) 600 MG tablet Take 1 tablet (600 mg total) by mouth every 8 (eight) hours as needed. 08/11/22   Raspet, Noberto RetortErin K, PA-C  sulfamethoxazole-trimethoprim (BACTRIM DS) 800-160 MG tablet Take 1 tablet by mouth 2 (two) times daily for 7 days. 08/11/22 08/18/22  Raspet, Noberto RetortErin K, PA-C      Allergies    Bactrim  [sulfamethoxazole-trimethoprim] and Mushroom extract complex    Review of Systems   Review of Systems  Constitutional:  Negative for activity change, appetite change and fever.  HENT:  Negative for congestion and rhinorrhea.   Respiratory:  Negative for cough, chest tightness and shortness of breath.   Cardiovascular:  Negative for chest pain.  Gastrointestinal:  Negative for abdominal pain, nausea and vomiting.  Genitourinary:  Negative for dysuria and hematuria.  Skin:  Positive for rash.  Neurological:  Negative for dizziness, weakness and headaches.   all other systems are negative except as noted in the HPI and PMH.     Physical Exam Updated Vital Signs Ht 5\' 4"  (1.626 m)   Wt 58 kg   LMP 07/29/2022 (Exact Date)   SpO2 100%   BMI 21.95 kg/m  Physical Exam Vitals and nursing note reviewed.  Constitutional:      General: She is not in acute distress.    Appearance: She is well-developed.  HENT:     Head: Normocephalic and atraumatic.     Mouth/Throat:     Pharynx: No oropharyngeal exudate.     Comments: Upper and lower lip swelling.  Tongue is normal.  Posterior pharynx is normal. Eyes:     Conjunctiva/sclera: Conjunctivae normal.     Pupils: Pupils are equal, round, and reactive to light.  Neck:     Comments: No meningismus.  Cardiovascular:     Rate and Rhythm: Normal rate and regular rhythm.     Heart sounds: Normal heart sounds. No murmur heard. Pulmonary:     Effort: Pulmonary effort is normal. No respiratory distress.     Breath sounds: Normal breath sounds.  Abdominal:     Palpations: Abdomen is soft.     Tenderness: There is no abdominal tenderness. There is no guarding or rebound.  Musculoskeletal:        General: No tenderness. Normal range of motion.     Cervical back: Normal range of motion and neck supple.  Skin:    General: Skin is warm.     Findings: Rash present.  Neurological:     Mental Status: She is alert and oriented to person, place, and  time.     Cranial Nerves: No cranial nerve deficit.     Motor: No abnormal muscle tone.     Coordination: Coordination normal.     Comments:  5/5 strength throughout. CN 2-12 intact.Equal grip strength.   Psychiatric:        Behavior: Behavior normal.     ED Results / Procedures / Treatments   Labs (all labs ordered are listed, but only abnormal results are displayed) Labs Reviewed  CBC WITH DIFFERENTIAL/PLATELET - Abnormal; Notable for the following components:      Result Value   Monocytes Absolute 1.7 (*)    All other components within normal limits  BASIC METABOLIC PANEL - Abnormal; Notable for the following components:   Potassium 3.1 (*)    Glucose, Bld 112 (*)    Calcium 8.1 (*)    All other components within normal limits  I-STAT BETA HCG BLOOD, ED ([facility], [facility], [facility] ONLY)    EKG EKG Interpretation  Date/Time:  Saturday August 15 2022 02:16:16 EDT Ventricular Rate:  79 PR Interval:  168 QRS Duration: 97 QT Interval:  379 QTC Calculation: 435 R Axis:   90 Text Interpretation: Sinus rhythm Consider left atrial enlargement Borderline right axis deviation Borderline T abnormalities, anterior leads No previous ECGs available Confirmed by Glynn Octaveancour, Matan Steen [phone]) on 08/15/2022 2:23:38 AM  Radiology No results found.  Procedures .Critical Care  Performed by: Glynn Octaveancour, Lorea Kupfer, MD Authorized by: Glynn Octaveancour, Levii Hairfield, MD   Critical care provider statement:    Critical care time (minutes):  35   Critical care time was exclusive of:  Separately billable procedures and treating other patients   Critical care was necessary to treat or prevent imminent or life-threatening deterioration of the following conditions: angioedema.   Critical care was time spent personally by me on the following activities:  Development of treatment plan with patient or surrogate, discussions with consultants, evaluation of patient's response to treatment, examination of patient, ordering and review of  laboratory studies, ordering and review of radiographic studies, ordering and performing treatments and interventions, pulse oximetry, re-evaluation of patient's condition, review of old charts, blood draw for specimens and obtaining history from patient or surrogate   I assumed direction of critical care for this patient from another provider in my specialty: no       Medications Ordered in ED Medications  lactated ringers bolus 1,000 mL (has no administration in time range)  methylPREDNISolone sodium succinate (SOLU-MEDROL) 125 mg/2 mL injection 125 mg (has no administration in time range)  famotidine (PEPCID) IVPB 20 mg premix (has no administration in time range)    ED Course/ Medical Decision Making/ A&P  Medical Decision Making Amount and/or Complexity of Data Reviewed Labs: ordered. Decision-making details documented in ED Course. Radiology: ordered and independent interpretation performed. Decision-making details documented in ED Course. ECG/medicine tests: ordered and independent interpretation performed. Decision-making details documented in ED Course.  Risk Prescription drug management.   Concern for allergic reaction to sulfa.  Does not take ACE inhibitor or ARB.  She was given IM epinephrine, Benadryl by EMS.  She has no posterior pharynx swelling or tongue swelling.  No chest pain or shortness of breath.  Will give IV fluids, steroids and Pepcid.  Bactrim added to allergy list.  Patient observed in the ED 3 hours status post epinephrine injection, no deterioration of symptoms.  Lip swelling is improving.  No tongue swelling or posterior pharynx swelling.  No chest pain or shortness of breath.  Discussed stopping Bactrim and never taking any sulfa containing antibiotics again.  Discussed steroids and antihistamines for home use.  EpiPen prescribed with instructions for use.  Will prescribe doxycycline as alternative to Bactrim for her  Bartholin's abscess.  Follow-up with PCP.  Return to the ED with difficulty breathing, difficulty swallowing, tongue or lip swelling or other concerns.       Final Clinical Impression(s) / ED Diagnoses Final diagnoses:  Allergic reaction, initial encounter    Rx / DC Orders ED Discharge Orders     None         Andra Heslin, Jeannett Senior, MD 08/15/22 [phone]

## 2022-08-15 NOTE — Discharge Instructions (Signed)
Stop taking Bactrim.  Do not take this medication again or any other medication from the sulfa family.  Continue the steroids as prescribed.  Use Benadryl as needed for itching.  Use the epinephrine pen only for difficulty breathing, difficulty swallowing, tongue or lip swelling.  If you use the epinephrine pen, you must come to the hospital afterwards to be evaluated. Return to the ED with difficulty breathing, chest pain, tongue or lip swelling, difficulty swallowing or other concerns.

## 2024-05-21 ENCOUNTER — Encounter (HOSPITAL_COMMUNITY): Payer: Self-pay | Admitting: *Deleted

## 2024-05-21 ENCOUNTER — Other Ambulatory Visit: Payer: Self-pay

## 2024-05-21 ENCOUNTER — Emergency Department (HOSPITAL_COMMUNITY)
Admission: EM | Admit: 2024-05-21 | Discharge: 2024-05-21 | Discharge status: Against Medical Advice | Attending: Emergency Medicine | Admitting: Emergency Medicine

## 2024-05-21 DIAGNOSIS — Z5321 Procedure and treatment not carried out due to patient leaving prior to being seen by health care provider: Secondary | ICD-10-CM | POA: Insufficient documentation

## 2024-05-21 DIAGNOSIS — R519 Headache, unspecified: Secondary | ICD-10-CM | POA: Insufficient documentation

## 2024-05-21 DIAGNOSIS — J029 Acute pharyngitis, unspecified: Secondary | ICD-10-CM | POA: Insufficient documentation

## 2024-05-21 NOTE — ED Triage Notes (Signed)
 The pt  has had a headache throat itching and muffled hearing for onr week  she has a sick child for one week that is also being checked in peds as we speak  no known temp.  Lmp yesterday

## 2024-05-21 NOTE — ED Notes (Signed)
 Pt left due to wait time according to family member who is also a pt
